# Patient Record
Sex: Female | Born: 1945 | Race: Black or African American | Hispanic: No | Smoking: Never smoker
Health system: Southern US, Community
[De-identification: ages and names within clinical notes are randomized; demographics above are authoritative.]

## PROBLEM LIST (undated history)

## (undated) DIAGNOSIS — I10 Essential (primary) hypertension: Secondary | ICD-10-CM

## (undated) DIAGNOSIS — D649 Anemia, unspecified: Secondary | ICD-10-CM

## (undated) DIAGNOSIS — J189 Pneumonia, unspecified organism: Secondary | ICD-10-CM

## (undated) DIAGNOSIS — J45909 Unspecified asthma, uncomplicated: Secondary | ICD-10-CM

## (undated) DIAGNOSIS — E119 Type 2 diabetes mellitus without complications: Secondary | ICD-10-CM

## (undated) DIAGNOSIS — E7800 Pure hypercholesterolemia, unspecified: Secondary | ICD-10-CM

## (undated) DIAGNOSIS — M1990 Unspecified osteoarthritis, unspecified site: Secondary | ICD-10-CM

## (undated) DIAGNOSIS — R0600 Dyspnea, unspecified: Secondary | ICD-10-CM

## (undated) HISTORY — PX: NO PAST SURGERIES: SHX2092

---

## 2007-10-01 ENCOUNTER — Emergency Department (HOSPITAL_COMMUNITY): Admission: EM | Admit: 2007-10-01 | Discharge: 2007-10-01 | Payer: Self-pay | Admitting: Emergency Medicine

## 2008-10-14 ENCOUNTER — Emergency Department (HOSPITAL_COMMUNITY): Admission: EM | Admit: 2008-10-14 | Discharge: 2008-10-14 | Payer: Self-pay | Admitting: Emergency Medicine

## 2012-02-28 DIAGNOSIS — E785 Hyperlipidemia, unspecified: Secondary | ICD-10-CM | POA: Diagnosis not present

## 2012-02-28 DIAGNOSIS — I10 Essential (primary) hypertension: Secondary | ICD-10-CM | POA: Diagnosis not present

## 2012-02-28 DIAGNOSIS — E119 Type 2 diabetes mellitus without complications: Secondary | ICD-10-CM | POA: Diagnosis not present

## 2012-05-04 DIAGNOSIS — M1710 Unilateral primary osteoarthritis, unspecified knee: Secondary | ICD-10-CM | POA: Diagnosis not present

## 2012-08-10 DIAGNOSIS — M1710 Unilateral primary osteoarthritis, unspecified knee: Secondary | ICD-10-CM | POA: Diagnosis not present

## 2012-08-25 DIAGNOSIS — E785 Hyperlipidemia, unspecified: Secondary | ICD-10-CM | POA: Diagnosis not present

## 2012-08-25 DIAGNOSIS — Z23 Encounter for immunization: Secondary | ICD-10-CM | POA: Diagnosis not present

## 2012-08-25 DIAGNOSIS — I10 Essential (primary) hypertension: Secondary | ICD-10-CM | POA: Diagnosis not present

## 2012-08-25 DIAGNOSIS — E119 Type 2 diabetes mellitus without complications: Secondary | ICD-10-CM | POA: Diagnosis not present

## 2012-11-08 DIAGNOSIS — M1710 Unilateral primary osteoarthritis, unspecified knee: Secondary | ICD-10-CM | POA: Diagnosis not present

## 2013-02-15 DIAGNOSIS — H40019 Open angle with borderline findings, low risk, unspecified eye: Secondary | ICD-10-CM | POA: Diagnosis not present

## 2013-02-15 DIAGNOSIS — H2510 Age-related nuclear cataract, unspecified eye: Secondary | ICD-10-CM | POA: Diagnosis not present

## 2013-02-15 DIAGNOSIS — E119 Type 2 diabetes mellitus without complications: Secondary | ICD-10-CM | POA: Diagnosis not present

## 2013-03-29 DIAGNOSIS — I10 Essential (primary) hypertension: Secondary | ICD-10-CM | POA: Diagnosis not present

## 2013-03-29 DIAGNOSIS — M79609 Pain in unspecified limb: Secondary | ICD-10-CM | POA: Diagnosis not present

## 2013-03-29 DIAGNOSIS — E119 Type 2 diabetes mellitus without complications: Secondary | ICD-10-CM | POA: Diagnosis not present

## 2013-03-29 DIAGNOSIS — E785 Hyperlipidemia, unspecified: Secondary | ICD-10-CM | POA: Diagnosis not present

## 2013-05-02 DIAGNOSIS — M1710 Unilateral primary osteoarthritis, unspecified knee: Secondary | ICD-10-CM | POA: Diagnosis not present

## 2013-06-21 DIAGNOSIS — M1710 Unilateral primary osteoarthritis, unspecified knee: Secondary | ICD-10-CM | POA: Diagnosis not present

## 2013-09-20 DIAGNOSIS — M1710 Unilateral primary osteoarthritis, unspecified knee: Secondary | ICD-10-CM | POA: Diagnosis not present

## 2013-11-01 DIAGNOSIS — E785 Hyperlipidemia, unspecified: Secondary | ICD-10-CM | POA: Diagnosis not present

## 2013-11-01 DIAGNOSIS — I10 Essential (primary) hypertension: Secondary | ICD-10-CM | POA: Diagnosis not present

## 2013-11-01 DIAGNOSIS — E1129 Type 2 diabetes mellitus with other diabetic kidney complication: Secondary | ICD-10-CM | POA: Diagnosis not present

## 2013-12-20 DIAGNOSIS — M1710 Unilateral primary osteoarthritis, unspecified knee: Secondary | ICD-10-CM | POA: Diagnosis not present

## 2014-02-15 DIAGNOSIS — Z23 Encounter for immunization: Secondary | ICD-10-CM | POA: Diagnosis not present

## 2014-03-28 DIAGNOSIS — M170 Bilateral primary osteoarthritis of knee: Secondary | ICD-10-CM | POA: Diagnosis not present

## 2014-08-15 DIAGNOSIS — M170 Bilateral primary osteoarthritis of knee: Secondary | ICD-10-CM | POA: Diagnosis not present

## 2014-10-03 DIAGNOSIS — E785 Hyperlipidemia, unspecified: Secondary | ICD-10-CM | POA: Diagnosis not present

## 2014-10-03 DIAGNOSIS — E119 Type 2 diabetes mellitus without complications: Secondary | ICD-10-CM | POA: Diagnosis not present

## 2014-10-03 DIAGNOSIS — M899 Disorder of bone, unspecified: Secondary | ICD-10-CM | POA: Diagnosis not present

## 2014-10-03 DIAGNOSIS — I10 Essential (primary) hypertension: Secondary | ICD-10-CM | POA: Diagnosis not present

## 2014-10-03 DIAGNOSIS — M0080 Arthritis due to other bacteria, unspecified joint: Secondary | ICD-10-CM | POA: Diagnosis not present

## 2014-10-08 ENCOUNTER — Other Ambulatory Visit (HOSPITAL_COMMUNITY): Payer: Self-pay | Admitting: Internal Medicine

## 2014-10-08 DIAGNOSIS — M8580 Other specified disorders of bone density and structure, unspecified site: Secondary | ICD-10-CM

## 2014-10-08 DIAGNOSIS — Z1231 Encounter for screening mammogram for malignant neoplasm of breast: Secondary | ICD-10-CM

## 2014-10-11 DIAGNOSIS — I10 Essential (primary) hypertension: Secondary | ICD-10-CM | POA: Diagnosis not present

## 2014-10-11 DIAGNOSIS — Z23 Encounter for immunization: Secondary | ICD-10-CM | POA: Diagnosis not present

## 2014-10-11 DIAGNOSIS — R944 Abnormal results of kidney function studies: Secondary | ICD-10-CM | POA: Diagnosis not present

## 2014-10-11 DIAGNOSIS — E119 Type 2 diabetes mellitus without complications: Secondary | ICD-10-CM | POA: Diagnosis not present

## 2014-10-11 DIAGNOSIS — E785 Hyperlipidemia, unspecified: Secondary | ICD-10-CM | POA: Diagnosis not present

## 2014-10-14 ENCOUNTER — Ambulatory Visit (HOSPITAL_COMMUNITY)
Admission: RE | Admit: 2014-10-14 | Discharge: 2014-10-14 | Disposition: A | Discharge status: Home / Self Care | Payer: Medicare Other | Source: Ambulatory Visit | Attending: Internal Medicine | Admitting: Internal Medicine

## 2014-10-14 DIAGNOSIS — R29890 Loss of height: Secondary | ICD-10-CM | POA: Diagnosis not present

## 2014-10-14 DIAGNOSIS — Z780 Asymptomatic menopausal state: Secondary | ICD-10-CM | POA: Insufficient documentation

## 2014-10-14 DIAGNOSIS — M8580 Other specified disorders of bone density and structure, unspecified site: Secondary | ICD-10-CM

## 2014-10-14 DIAGNOSIS — Z13820 Encounter for screening for osteoporosis: Secondary | ICD-10-CM | POA: Diagnosis not present

## 2014-10-14 DIAGNOSIS — Z1231 Encounter for screening mammogram for malignant neoplasm of breast: Secondary | ICD-10-CM | POA: Diagnosis present

## 2014-10-18 DIAGNOSIS — I10 Essential (primary) hypertension: Secondary | ICD-10-CM | POA: Diagnosis not present

## 2014-10-18 DIAGNOSIS — R944 Abnormal results of kidney function studies: Secondary | ICD-10-CM | POA: Diagnosis not present

## 2014-10-18 DIAGNOSIS — E785 Hyperlipidemia, unspecified: Secondary | ICD-10-CM | POA: Diagnosis not present

## 2014-10-18 DIAGNOSIS — Z23 Encounter for immunization: Secondary | ICD-10-CM | POA: Diagnosis not present

## 2014-10-18 DIAGNOSIS — E119 Type 2 diabetes mellitus without complications: Secondary | ICD-10-CM | POA: Diagnosis not present

## 2014-10-18 DIAGNOSIS — Z01419 Encounter for gynecological examination (general) (routine) without abnormal findings: Secondary | ICD-10-CM | POA: Diagnosis not present

## 2015-01-06 DIAGNOSIS — M170 Bilateral primary osteoarthritis of knee: Secondary | ICD-10-CM | POA: Diagnosis not present

## 2015-01-17 DIAGNOSIS — E785 Hyperlipidemia, unspecified: Secondary | ICD-10-CM | POA: Diagnosis not present

## 2015-01-17 DIAGNOSIS — E119 Type 2 diabetes mellitus without complications: Secondary | ICD-10-CM | POA: Diagnosis not present

## 2015-01-17 DIAGNOSIS — I10 Essential (primary) hypertension: Secondary | ICD-10-CM | POA: Diagnosis not present

## 2015-01-21 DIAGNOSIS — E785 Hyperlipidemia, unspecified: Secondary | ICD-10-CM | POA: Diagnosis not present

## 2015-01-21 DIAGNOSIS — Z23 Encounter for immunization: Secondary | ICD-10-CM | POA: Diagnosis not present

## 2015-01-21 DIAGNOSIS — R944 Abnormal results of kidney function studies: Secondary | ICD-10-CM | POA: Diagnosis not present

## 2015-01-21 DIAGNOSIS — I10 Essential (primary) hypertension: Secondary | ICD-10-CM | POA: Diagnosis not present

## 2015-01-21 DIAGNOSIS — E1165 Type 2 diabetes mellitus with hyperglycemia: Secondary | ICD-10-CM | POA: Diagnosis not present

## 2015-02-11 DIAGNOSIS — H6120 Impacted cerumen, unspecified ear: Secondary | ICD-10-CM | POA: Diagnosis not present

## 2015-02-11 DIAGNOSIS — I10 Essential (primary) hypertension: Secondary | ICD-10-CM | POA: Diagnosis not present

## 2015-02-12 DIAGNOSIS — M25561 Pain in right knee: Secondary | ICD-10-CM | POA: Diagnosis not present

## 2015-02-12 DIAGNOSIS — M170 Bilateral primary osteoarthritis of knee: Secondary | ICD-10-CM | POA: Diagnosis not present

## 2015-02-12 DIAGNOSIS — M25562 Pain in left knee: Secondary | ICD-10-CM | POA: Diagnosis not present

## 2015-02-18 DIAGNOSIS — H52223 Regular astigmatism, bilateral: Secondary | ICD-10-CM | POA: Diagnosis not present

## 2015-02-18 DIAGNOSIS — G514 Facial myokymia: Secondary | ICD-10-CM | POA: Diagnosis not present

## 2015-02-18 DIAGNOSIS — H524 Presbyopia: Secondary | ICD-10-CM | POA: Diagnosis not present

## 2015-02-18 DIAGNOSIS — H5203 Hypermetropia, bilateral: Secondary | ICD-10-CM | POA: Diagnosis not present

## 2015-04-09 DIAGNOSIS — M25562 Pain in left knee: Secondary | ICD-10-CM | POA: Diagnosis not present

## 2015-04-09 DIAGNOSIS — M1712 Unilateral primary osteoarthritis, left knee: Secondary | ICD-10-CM | POA: Diagnosis not present

## 2015-04-09 DIAGNOSIS — M25561 Pain in right knee: Secondary | ICD-10-CM | POA: Diagnosis not present

## 2015-04-09 DIAGNOSIS — M170 Bilateral primary osteoarthritis of knee: Secondary | ICD-10-CM | POA: Diagnosis not present

## 2015-04-09 DIAGNOSIS — M1711 Unilateral primary osteoarthritis, right knee: Secondary | ICD-10-CM | POA: Diagnosis not present

## 2015-05-22 DIAGNOSIS — E785 Hyperlipidemia, unspecified: Secondary | ICD-10-CM | POA: Diagnosis not present

## 2015-05-22 DIAGNOSIS — E119 Type 2 diabetes mellitus without complications: Secondary | ICD-10-CM | POA: Diagnosis not present

## 2015-05-27 DIAGNOSIS — E782 Mixed hyperlipidemia: Secondary | ICD-10-CM | POA: Diagnosis not present

## 2015-05-27 DIAGNOSIS — E1122 Type 2 diabetes mellitus with diabetic chronic kidney disease: Secondary | ICD-10-CM | POA: Diagnosis not present

## 2015-05-27 DIAGNOSIS — N182 Chronic kidney disease, stage 2 (mild): Secondary | ICD-10-CM | POA: Diagnosis not present

## 2015-05-27 DIAGNOSIS — I10 Essential (primary) hypertension: Secondary | ICD-10-CM | POA: Diagnosis not present

## 2015-06-02 ENCOUNTER — Telehealth: Payer: Self-pay

## 2015-06-02 DIAGNOSIS — Z1211 Encounter for screening for malignant neoplasm of colon: Secondary | ICD-10-CM

## 2015-06-02 NOTE — Telephone Encounter (Signed)
[id number]    PATIENT RECEIVED LETTER FOR TCS

## 2015-06-03 ENCOUNTER — Other Ambulatory Visit: Payer: Self-pay

## 2015-06-03 DIAGNOSIS — Z1211 Encounter for screening for malignant neoplasm of colon: Secondary | ICD-10-CM

## 2015-06-03 NOTE — Telephone Encounter (Signed)
See separate triage.  

## 2015-06-03 NOTE — Telephone Encounter (Signed)
Gastroenterology Pre-Procedure Review  Request Date: 06/02/2015 Requesting Physician: Dr. Wende Neighbors  PATIENT REVIEW QUESTIONS: The patient responded to the following health history questions as indicated:    1. Diabetes Melitis: no 2. Joint replacements in the past 12 months: no 3. Major health problems in the past 3 months: no 4. Has an artificial valve or MVP: no 5. Has a defibrillator: no 6. Has been advised in past to take antibiotics in advance of a procedure like teeth cleaning: no 7. Family history of colon cancer: no  8. Alcohol Use: OCCASIONALLY   SHE WILL HAVE A DRINK ONCE OR TWICE A MONTH 9. History of sleep apnea: no     MEDICATIONS & ALLERGIES: Coumadi    Patient reports the following regarding taking any blood thinners:   Plavix? no Aspirin? Yes Coumadin? NO  Patient confirms/reports the following medications:  Current Outpatient Prescriptions  Medication Sig Dispense Refill  . amLODipine (NORVASC) 10 MG tablet Take 10 mg by mouth daily.    Marland Kitchen aspirin 81 MG tablet Take 81 mg by mouth daily.    Marland Kitchen ibuprofen (ADVIL,MOTRIN) 600 MG tablet Take 600 mg by mouth every 6 (six) hours as needed. Takes one to two tablets every day as needed    . lovastatin (MEVACOR) 20 MG tablet Take 20 mg by mouth at bedtime.    . NON FORMULARY Iron OTC  ( not sure of strength  She has just run out)  But takes one tablet every other day    . potassium chloride (K-DUR) 10 MEQ tablet Take 10 mEq by mouth 2 (two) times daily.     No current facility-administered medications for this visit.    Patient confirms/reports the following allergies:  No Known Allergies  No orders of the defined types were placed in this encounter.    AUTHORIZATION INFORMATION Primary Insurance:   ID #:  Group #:  Pre-Cert / Auth required:  Pre-Cert / Auth #:   Secondary Insurance:   ID #: Group #:  Pre-Cert / Auth required: Pre-Cert / Auth #:   SCHEDULE INFORMATION: Procedure has been scheduled as follows:   Date: 06/20/2015            Time:  9:00 AM Location: [facility] Short Stay  This Gastroenterology Pre-Precedure Review Form is being routed to the following provider(s): R. Garfield Cornea, MD

## 2015-06-03 NOTE — Telephone Encounter (Signed)
Appropriate.

## 2015-06-04 MED ORDER — PEG 3350-KCL-NA BICARB-NACL 420 G PO SOLR: 4000.0000 mL | ORAL | Status: DC

## 2015-06-04 NOTE — Telephone Encounter (Signed)
Rx sent to the pharmacy and instructions mailed to pt.  

## 2015-06-20 ENCOUNTER — Ambulatory Visit (HOSPITAL_COMMUNITY)
Admission: RE | Admit: 2015-06-20 | Discharge: 2015-06-20 | Disposition: A | Discharge status: Home / Self Care | Payer: Medicare Other | Source: Ambulatory Visit | Attending: Internal Medicine | Admitting: Internal Medicine

## 2015-06-20 ENCOUNTER — Encounter (HOSPITAL_COMMUNITY)
Admission: RE | Disposition: A | Discharge status: Home / Self Care | Payer: Self-pay | Source: Ambulatory Visit | Attending: Internal Medicine

## 2015-06-20 ENCOUNTER — Encounter (HOSPITAL_COMMUNITY): Payer: Self-pay | Admitting: *Deleted

## 2015-06-20 DIAGNOSIS — E7800 Pure hypercholesterolemia, unspecified: Secondary | ICD-10-CM | POA: Insufficient documentation

## 2015-06-20 DIAGNOSIS — Z86010 Personal history of colonic polyps: Secondary | ICD-10-CM | POA: Insufficient documentation

## 2015-06-20 DIAGNOSIS — Z1211 Encounter for screening for malignant neoplasm of colon: Secondary | ICD-10-CM

## 2015-06-20 DIAGNOSIS — Z7982 Long term (current) use of aspirin: Secondary | ICD-10-CM | POA: Insufficient documentation

## 2015-06-20 DIAGNOSIS — Z791 Long term (current) use of non-steroidal anti-inflammatories (NSAID): Secondary | ICD-10-CM | POA: Diagnosis not present

## 2015-06-20 DIAGNOSIS — D120 Benign neoplasm of cecum: Secondary | ICD-10-CM | POA: Diagnosis not present

## 2015-06-20 DIAGNOSIS — Z79899 Other long term (current) drug therapy: Secondary | ICD-10-CM | POA: Diagnosis not present

## 2015-06-20 DIAGNOSIS — I10 Essential (primary) hypertension: Secondary | ICD-10-CM | POA: Diagnosis not present

## 2015-06-20 DIAGNOSIS — D123 Benign neoplasm of transverse colon: Secondary | ICD-10-CM | POA: Diagnosis not present

## 2015-06-20 HISTORY — DX: Pure hypercholesterolemia, unspecified: E78.00

## 2015-06-20 HISTORY — PX: POLYPECTOMY: SHX5525

## 2015-06-20 HISTORY — PX: COLONOSCOPY: SHX5424

## 2015-06-20 HISTORY — DX: Essential (primary) hypertension: I10

## 2015-06-20 SURGERY — COLONOSCOPY
Anesthesia: Moderate Sedation

## 2015-06-20 MED ORDER — SODIUM CHLORIDE 0.9% FLUSH
INTRAVENOUS | Status: AC
  Filled 2015-06-20: qty 10

## 2015-06-20 MED ORDER — MEPERIDINE HCL 100 MG/ML IJ SOLN
INTRAMUSCULAR | Status: DC | PRN
  Administered 2015-06-20 (×2): 50 mg via INTRAVENOUS

## 2015-06-20 MED ORDER — ONDANSETRON HCL 4 MG/2ML IJ SOLN
INTRAMUSCULAR | Status: AC
  Filled 2015-06-20: qty 2

## 2015-06-20 MED ORDER — SODIUM CHLORIDE 0.9 % IV SOLN
INTRAVENOUS | Status: DC
  Administered 2015-06-20: 08:00:00 via INTRAVENOUS

## 2015-06-20 MED ORDER — STERILE WATER FOR IRRIGATION IR SOLN
Status: DC | PRN
  Administered 2015-06-20: 09:00:00

## 2015-06-20 MED ORDER — MEPERIDINE HCL 100 MG/ML IJ SOLN
INTRAMUSCULAR | Status: AC
  Filled 2015-06-20: qty 2

## 2015-06-20 MED ORDER — MIDAZOLAM HCL 5 MG/5ML IJ SOLN
INTRAMUSCULAR | Status: AC
  Filled 2015-06-20: qty 10

## 2015-06-20 MED ORDER — SIMETHICONE 40 MG/0.6ML PO SUSP
ORAL | Status: AC
  Filled 2015-06-20: qty 30

## 2015-06-20 MED ORDER — ONDANSETRON HCL 4 MG/2ML IJ SOLN
INTRAMUSCULAR | Status: DC | PRN
  Administered 2015-06-20: 4 mg via INTRAVENOUS

## 2015-06-20 MED ORDER — MIDAZOLAM HCL 5 MG/5ML IJ SOLN
INTRAMUSCULAR | Status: DC | PRN
  Administered 2015-06-20 (×2): 2 mg via INTRAVENOUS

## 2015-06-20 NOTE — Op Note (Signed)
[facility] [address] [place], [address]   5COLONOSCOPY PROCEDURE REPORT  PATIENT: Sylvia Sparks, Sylvia Sparks  MR#: [id number] BIRTHDATE: Jan 29, 1946 , 69  yrs. old GENDER: female ENDOSCOPIST: R.  Garfield Cornea, MD FACP [facility] REFERRED EO:[id number] Hall, M.D. PROCEDURE DATE:  2015/07/11 PROCEDURE:   Colonoscopy with snare polypectomy INDICATIONS:First ever average risk colon cancer screening examination. MEDICATIONS: Versed 4 mg IV and Demerol 100 mg IV in divided doses. Zofran 4 mg IV. ASA CLASS:       Class II  CONSENT: The risks, benefits, alternatives and imponderables including but not limited to bleeding, perforation as well as the possibility of a missed lesion have been reviewed.  The potential for biopsy, lesion removal, etc. have also been discussed. Questions have been answered.  All parties agreeable.  Please see the history and physical in the medical record for more information.  DESCRIPTION OF PROCEDURE:   After the risks benefits and alternatives of the procedure were thoroughly explained, informed consent was obtained.  The digital rectal exam revealed no abnormalities of the rectum.   The [id number]  endoscope was introduced through the anus and advanced to the cecum, which was identified by both the appendix and ileocecal valve. No adverse events experienced.   The quality of the prep was adequate  The instrument was then slowly withdrawn as the colon was fully examined. Estimated blood loss is zero unless otherwise noted in this procedure report.      COLON FINDINGS: Normal-appearing rectal mucosa.  Few scattered pancolonic diverticula.  Multiple ascending colon polyps - 1.5 cm -1.5 cm flat carpet polyp opposite the ileocecal valve with multiple smaller polyps in the vicinity.  Patient had (2) polyps at the hepatic flexure.  The remainder of the colonic mucosa.  Normal  The large carpet polyp was lifted away from the colonic wall  with submucosal saline injection through the sclero-needle.  This was done nicely without difficulty.  Subsequently , piecemeal hot snare polypectomy was performed.  Multiple other cold and hot snare polypectomies were performed to remove the above-mentioned polyps. Retroflexion was performed. .  Withdrawal time=23 minutes 0 seconds.  The scope was withdrawn and the procedure completed. COMPLICATIONS: There were no immediate complications. EBL 2 mL ENDOSCOPIC IMPRESSION: Multiple ascending colon polyp removed as described above. Pancolonic diverticulosis?"  RECOMMENDATIONS: Follow-up on pathology.  eSigned:  R. Garfield Cornea, MD Rosalita Chessman [facility] 07/11/2015 9:30 AM   cc:  CPT CODES: ICD CODES:  The ICD and CPT codes recommended by this software are interpretations from the data that the clinical staff has captured with the software.  The verification of the translation of this report to the ICD and CPT codes and modifiers is the sole responsibility of the health care institution and practicing physician where this report was generated.  [place] will not be held responsible for the validity of the ICD and CPT codes included on this report.  AMA assumes no liability for data contained or not contained herein. CPT is a Designer, television/film set of the [employer].  PATIENT NAME:  Sylvia Sparks, Sylvia Sparks MR#: [id number]

## 2015-06-20 NOTE — H&P (Signed)
@LOGO @   Primary Care Physician:  Wende Neighbors, MD Primary Gastroenterologist:  Dr. Gala Romney  Pre-Procedure History & Physical: HPI:  Sylvia Sparks is a 70 y.o. female is here for a screening colonoscopy. First ever colonoscopy. No bowel symptoms. No family history of colon cancer.  Past Medical History  Diagnosis Date  . Hypertension   . Hypercholesteremia     Past Surgical History  Procedure Laterality Date  . No past surgeries      Prior to Admission medications   Medication Sig Start Date End Date Taking? Authorizing Provider  amLODipine-valsartan (EXFORGE) 10-160 MG tablet Take 1 tablet by mouth daily. 04/08/15  Yes Historical Provider, MD  aspirin 81 MG tablet Take 81 mg by mouth daily.   Yes Historical Provider, MD  ibuprofen (ADVIL,MOTRIN) 600 MG tablet Take 600 mg by mouth every 6 (six) hours as needed for moderate pain. Takes one to two tablets every day as needed   Yes Historical Provider, MD  lovastatin (MEVACOR) 20 MG tablet Take 20 mg by mouth at bedtime.   Yes Historical Provider, MD  Multiple Vitamins-Minerals (MULTIVITAMINS THER. W/MINERALS) TABS tablet Take 1 tablet by mouth daily.   Yes Historical Provider, MD  NON FORMULARY Iron OTC  ( not sure of strength  She has just run out)  But takes one tablet every other day   Yes Historical Provider, MD  polyethylene glycol-electrolytes (TRILYTE) 420 g solution Take 4,000 mLs by mouth as directed. 06/04/15  Yes Daneil Dolin, MD  potassium chloride (K-DUR) 10 MEQ tablet Take 10 mEq by mouth 2 (two) times daily.   Yes Historical Provider, MD    Allergies as of 06/03/2015  . (No Known Allergies)    History reviewed. No pertinent family history.  Social History   Social History  . Marital Status: Widowed    Spouse Name: N/A  . Number of Children: N/A  . Years of Education: N/A   Occupational History  . Not on file.   Social History Main Topics  . Smoking status: Never Smoker   . Smokeless tobacco: Not on  file  . Alcohol Use: Yes     Comment: Occasional glass of wine  . Drug Use: No  . Sexual Activity: Not on file   Other Topics Concern  . Not on file   Social History Narrative  . No narrative on file    Review of Systems: See HPI, otherwise negative ROS  Physical Exam: BP 136/73 mmHg  Pulse 92  Temp(Src) 97.9 F (36.6 C) (Oral)  Resp 17  Ht 5\' 7"  (1.702 m)  Wt 172 lb (78.019 kg)  BMI 26.93 kg/m2  SpO2 100% General:   Alert,  Well-developed, well-nourished, pleasant and cooperative in NAD Neck:  Supple; no masses or thyromegaly. Lungs:  Clear throughout to auscultation.   No wheezes, crackles, or rhonchi. No acute distress. Heart:  Regular rate and rhythm; no murmurs, clicks, rubs,  or gallops. Abdomen:  Soft, nontender and nondistended. No masses, hepatosplenomegaly or hernias noted. Normal bowel sounds, without guarding, and without rebound.     Impression/Plan: Sylvia Sparks is now here to undergo a screening colonoscopy.  First ever average risk screening examination. Risks, benefits, limitations, imponderables and alternatives regarding colonoscopy have been reviewed with the patient. Questions have been answered. All parties agreeable.     Notice:  This dictation was prepared with Dragon dictation along with smaller phrase technology. Any transcriptional errors that result from this process are unintentional and may not be  corrected upon review.

## 2015-06-20 NOTE — Discharge Instructions (Signed)
Colonoscopy Discharge Instructions  Read the instructions outlined below and refer to this sheet in the next few weeks. These discharge instructions provide you with general information on caring for yourself after you leave the hospital. Your doctor may also give you specific instructions. While your treatment has been planned according to the most current medical practices available, unavoidable complications occasionally occur. If you have any problems or questions after discharge, call Dr. Gala Romney at [phone]. ACTIVITY  You may resume your regular activity, but move at a slower pace for the next 24 hours.   Take frequent rest periods for the next 24 hours.   Walking will help get rid of the air and reduce the bloated feeling in your belly (abdomen).   No driving for 24 hours (because of the medicine (anesthesia) used during the test).    Do not sign any important legal documents or operate any machinery for 24 hours (because of the anesthesia used during the test).  NUTRITION  Drink plenty of fluids.   You may resume your normal diet as instructed by your doctor.   Begin with a light meal and progress to your normal diet. Heavy or fried foods are harder to digest and may make you feel sick to your stomach (nauseated).   Avoid alcoholic beverages for 24 hours or as instructed.  MEDICATIONS  You may resume your normal medications unless your doctor tells you otherwise.  WHAT YOU CAN EXPECT TODAY  Some feelings of bloating in the abdomen.   Passage of more gas than usual.   Spotting of blood in your stool or on the toilet paper.  IF YOU HAD POLYPS REMOVED DURING THE COLONOSCOPY:  No aspirin products for 7 days or as instructed.   No alcohol for 7 days or as instructed.   Eat a soft diet for the next 24 hours.  FINDING OUT THE RESULTS OF YOUR TEST Not all test results are available during your visit. If your test results are not back during the visit, make an appointment  with your caregiver to find out the results. Do not assume everything is normal if you have not heard from your caregiver or the medical facility. It is important for you to follow up on all of your test results.  SEEK IMMEDIATE MEDICAL ATTENTION IF:  You have more than a spotting of blood in your stool.   Your belly is swollen (abdominal distention).   You are nauseated or vomiting.   You have a temperature over 101.   You have abdominal pain or discomfort that is severe or gets worse throughout the day.    Colon polyp and diverticulosis information provided  Further recommendations to follow pending review of pathology report  No aspirin or arthritis medications for 7 days    Colon Polyps Polyps are lumps of extra tissue growing inside the body. Polyps can grow in the large intestine (colon). Most colon polyps are noncancerous (benign). However, some colon polyps can become cancerous over time. Polyps that are larger than a pea may be harmful. To be safe, caregivers remove and test all polyps. CAUSES  Polyps form when mutations in the genes cause your cells to grow and divide even though no more tissue is needed. RISK FACTORS There are a number of risk factors that can increase your chances of getting colon polyps. They include:  Being older than 50 years.  Family history of colon polyps or colon cancer.  Long-term colon diseases, such as colitis or Crohn disease.  Being overweight.  Smoking.  Being inactive.  Drinking too much alcohol. SYMPTOMS  Most small polyps do not cause symptoms. If symptoms are present, they may include:  Blood in the stool. The stool may look dark red or black.  Constipation or diarrhea that lasts longer than 1 week. DIAGNOSIS People often do not know they have polyps until their caregiver finds them during a regular checkup. Your caregiver can use 4 tests to check for polyps:  Digital rectal exam. The caregiver wears gloves and feels  inside the rectum. This test would find polyps only in the rectum.  Barium enema. The caregiver puts a liquid called barium into your rectum before taking X-rays of your colon. Barium makes your colon look white. Polyps are dark, so they are easy to see in the X-ray pictures.  Sigmoidoscopy. A thin, flexible tube (sigmoidoscope) is placed into your rectum. The sigmoidoscope has a light and tiny camera in it. The caregiver uses the sigmoidoscope to look at the last third of your colon.  Colonoscopy. This test is like sigmoidoscopy, but the caregiver looks at the entire colon. This is the most common method for finding and removing polyps. TREATMENT  Any polyps will be removed during a sigmoidoscopy or colonoscopy. The polyps are then tested for cancer. PREVENTION  To help lower your risk of getting more colon polyps:  Eat plenty of fruits and vegetables. Avoid eating fatty foods.  Do not smoke.  Avoid drinking alcohol.  Exercise every day.  Lose weight if recommended by your caregiver.  Eat plenty of calcium and folate. Foods that are rich in calcium include milk, cheese, and broccoli. Foods that are rich in folate include chickpeas, kidney beans, and spinach. HOME CARE INSTRUCTIONS Keep all follow-up appointments as directed by your caregiver. You may need periodic exams to check for polyps. SEEK MEDICAL CARE IF: You notice bleeding during a bowel movement.   This information is not intended to replace advice given to you by your health care provider. Make sure you discuss any questions you have with your health care provider.   Document Released: 01/07/2004 Document Revised: 05/03/2014 Document Reviewed: 06/22/2011 Elsevier Interactive Patient Education 2016 [employer].     Diverticulosis Diverticulosis is the condition that develops when small pouches (diverticula) form in the wall of your colon. Your colon, or large intestine, is where water is absorbed and stool is formed.  The pouches form when the inside layer of your colon pushes through weak spots in the outer layers of your colon. CAUSES  No one knows exactly what causes diverticulosis. RISK FACTORS  Being older than 74. Your risk for this condition increases with age. Diverticulosis is rare in people younger than 40 years. By age 69, almost everyone has it.  Eating a low-fiber diet.  Being frequently constipated.  Being overweight.  Not getting enough exercise.  Smoking.  Taking over-the-counter pain medicines, like aspirin and ibuprofen. SYMPTOMS  Most people with diverticulosis do not have symptoms. DIAGNOSIS  Because diverticulosis often has no symptoms, health care providers often discover the condition during an exam for other colon problems. In many cases, a health care provider will diagnose diverticulosis while using a flexible scope to examine the colon (colonoscopy). TREATMENT  If you have never developed an infection related to diverticulosis, you may not need treatment. If you have had an infection before, treatment may include:  Eating more fruits, vegetables, and grains.  Taking a fiber supplement.  Taking a live bacteria supplement (probiotic).  Taking medicine to relax your colon. HOME CARE INSTRUCTIONS   Drink at least 6-8 glasses of water each day to prevent constipation.  Try not to strain when you have a bowel movement.  Keep all follow-up appointments. If you have had an infection before:  Increase the fiber in your diet as directed by your health care provider or dietitian.  Take a dietary fiber supplement if your health care provider approves.  Only take medicines as directed by your health care provider. SEEK MEDICAL CARE IF:   You have abdominal pain.  You have bloating.  You have cramps.  You have not gone to the bathroom in 3 days. SEEK IMMEDIATE MEDICAL CARE IF:   Your pain gets worse.  Yourbloating becomes very bad.  You have a fever or  chills, and your symptoms suddenly get worse.  You begin vomiting.  You have bowel movements that are bloody or black. MAKE SURE YOU:  Understand these instructions.  Will watch your condition.  Will get help right away if you are not doing well or get worse.   This information is not intended to replace advice given to you by your health care provider. Make sure you discuss any questions you have with your health care provider.   Document Released: 01/08/2004 Document Revised: 04/17/2013 Document Reviewed: 03/07/2013 Elsevier Interactive Patient Education [employer].

## 2015-06-23 ENCOUNTER — Encounter: Payer: Self-pay | Admitting: Internal Medicine

## 2015-06-24 ENCOUNTER — Encounter (HOSPITAL_COMMUNITY): Payer: Self-pay | Admitting: Internal Medicine

## 2015-07-09 DIAGNOSIS — M1712 Unilateral primary osteoarthritis, left knee: Secondary | ICD-10-CM | POA: Diagnosis not present

## 2015-07-09 DIAGNOSIS — M1711 Unilateral primary osteoarthritis, right knee: Secondary | ICD-10-CM | POA: Diagnosis not present

## 2015-07-09 DIAGNOSIS — M170 Bilateral primary osteoarthritis of knee: Secondary | ICD-10-CM | POA: Diagnosis not present

## 2015-07-09 DIAGNOSIS — M25562 Pain in left knee: Secondary | ICD-10-CM | POA: Diagnosis not present

## 2015-07-09 DIAGNOSIS — M25561 Pain in right knee: Secondary | ICD-10-CM | POA: Diagnosis not present

## 2015-10-10 DIAGNOSIS — M25562 Pain in left knee: Secondary | ICD-10-CM | POA: Diagnosis not present

## 2015-10-10 DIAGNOSIS — M25561 Pain in right knee: Secondary | ICD-10-CM | POA: Diagnosis not present

## 2015-10-10 DIAGNOSIS — M170 Bilateral primary osteoarthritis of knee: Secondary | ICD-10-CM | POA: Diagnosis not present

## 2015-12-04 ENCOUNTER — Other Ambulatory Visit (HOSPITAL_COMMUNITY): Payer: Self-pay | Admitting: Adult Health Nurse Practitioner

## 2015-12-04 DIAGNOSIS — Z1231 Encounter for screening mammogram for malignant neoplasm of breast: Secondary | ICD-10-CM

## 2015-12-11 ENCOUNTER — Ambulatory Visit (HOSPITAL_COMMUNITY)
Admission: RE | Admit: 2015-12-11 | Discharge: 2015-12-11 | Disposition: A | Discharge status: Home / Self Care | Payer: Medicare Other | Source: Ambulatory Visit | Attending: Adult Health Nurse Practitioner | Admitting: Adult Health Nurse Practitioner

## 2015-12-11 DIAGNOSIS — Z1231 Encounter for screening mammogram for malignant neoplasm of breast: Secondary | ICD-10-CM | POA: Diagnosis not present

## 2015-12-19 ENCOUNTER — Other Ambulatory Visit: Payer: Self-pay

## 2015-12-30 DIAGNOSIS — Z0000 Encounter for general adult medical examination without abnormal findings: Secondary | ICD-10-CM | POA: Diagnosis not present

## 2015-12-30 DIAGNOSIS — Z23 Encounter for immunization: Secondary | ICD-10-CM | POA: Diagnosis not present

## 2015-12-30 DIAGNOSIS — N182 Chronic kidney disease, stage 2 (mild): Secondary | ICD-10-CM | POA: Diagnosis not present

## 2015-12-30 DIAGNOSIS — I10 Essential (primary) hypertension: Secondary | ICD-10-CM | POA: Diagnosis not present

## 2015-12-30 DIAGNOSIS — E1122 Type 2 diabetes mellitus with diabetic chronic kidney disease: Secondary | ICD-10-CM | POA: Diagnosis not present

## 2015-12-30 DIAGNOSIS — E782 Mixed hyperlipidemia: Secondary | ICD-10-CM | POA: Diagnosis not present

## 2015-12-30 DIAGNOSIS — E785 Hyperlipidemia, unspecified: Secondary | ICD-10-CM | POA: Diagnosis not present

## 2016-01-14 DIAGNOSIS — M170 Bilateral primary osteoarthritis of knee: Secondary | ICD-10-CM | POA: Diagnosis not present

## 2016-01-14 DIAGNOSIS — M25561 Pain in right knee: Secondary | ICD-10-CM | POA: Diagnosis not present

## 2016-02-10 DIAGNOSIS — H25013 Cortical age-related cataract, bilateral: Secondary | ICD-10-CM | POA: Diagnosis not present

## 2016-02-10 DIAGNOSIS — H35033 Hypertensive retinopathy, bilateral: Secondary | ICD-10-CM | POA: Diagnosis not present

## 2016-02-10 DIAGNOSIS — H43812 Vitreous degeneration, left eye: Secondary | ICD-10-CM | POA: Diagnosis not present

## 2016-02-10 DIAGNOSIS — H2513 Age-related nuclear cataract, bilateral: Secondary | ICD-10-CM | POA: Diagnosis not present

## 2016-04-01 DIAGNOSIS — H43812 Vitreous degeneration, left eye: Secondary | ICD-10-CM | POA: Diagnosis not present

## 2016-04-16 DIAGNOSIS — G8929 Other chronic pain: Secondary | ICD-10-CM | POA: Diagnosis not present

## 2016-04-16 DIAGNOSIS — M170 Bilateral primary osteoarthritis of knee: Secondary | ICD-10-CM | POA: Diagnosis not present

## 2016-04-16 DIAGNOSIS — M25561 Pain in right knee: Secondary | ICD-10-CM | POA: Diagnosis not present

## 2016-04-16 DIAGNOSIS — M25562 Pain in left knee: Secondary | ICD-10-CM | POA: Diagnosis not present

## 2016-05-07 DIAGNOSIS — E782 Mixed hyperlipidemia: Secondary | ICD-10-CM | POA: Diagnosis not present

## 2016-05-07 DIAGNOSIS — E1122 Type 2 diabetes mellitus with diabetic chronic kidney disease: Secondary | ICD-10-CM | POA: Diagnosis not present

## 2016-05-19 DIAGNOSIS — I10 Essential (primary) hypertension: Secondary | ICD-10-CM | POA: Diagnosis not present

## 2016-05-19 DIAGNOSIS — E1122 Type 2 diabetes mellitus with diabetic chronic kidney disease: Secondary | ICD-10-CM | POA: Diagnosis not present

## 2016-05-19 DIAGNOSIS — E782 Mixed hyperlipidemia: Secondary | ICD-10-CM | POA: Diagnosis not present

## 2016-07-16 DIAGNOSIS — M25561 Pain in right knee: Secondary | ICD-10-CM | POA: Diagnosis not present

## 2016-07-16 DIAGNOSIS — G8929 Other chronic pain: Secondary | ICD-10-CM | POA: Diagnosis not present

## 2016-07-16 DIAGNOSIS — M25562 Pain in left knee: Secondary | ICD-10-CM | POA: Diagnosis not present

## 2016-07-16 DIAGNOSIS — M170 Bilateral primary osteoarthritis of knee: Secondary | ICD-10-CM | POA: Diagnosis not present

## 2016-10-02 LAB — BASIC METABOLIC PANEL: GLUCOSE: 137

## 2016-10-20 DIAGNOSIS — M170 Bilateral primary osteoarthritis of knee: Secondary | ICD-10-CM | POA: Diagnosis not present

## 2016-10-20 DIAGNOSIS — M25562 Pain in left knee: Secondary | ICD-10-CM | POA: Diagnosis not present

## 2016-10-20 DIAGNOSIS — M25561 Pain in right knee: Secondary | ICD-10-CM | POA: Diagnosis not present

## 2016-10-20 DIAGNOSIS — G8929 Other chronic pain: Secondary | ICD-10-CM | POA: Diagnosis not present

## 2016-11-17 DIAGNOSIS — E1122 Type 2 diabetes mellitus with diabetic chronic kidney disease: Secondary | ICD-10-CM | POA: Diagnosis not present

## 2016-11-22 DIAGNOSIS — Z6827 Body mass index (BMI) 27.0-27.9, adult: Secondary | ICD-10-CM | POA: Diagnosis not present

## 2016-11-22 DIAGNOSIS — N182 Chronic kidney disease, stage 2 (mild): Secondary | ICD-10-CM | POA: Diagnosis not present

## 2016-11-22 DIAGNOSIS — E1122 Type 2 diabetes mellitus with diabetic chronic kidney disease: Secondary | ICD-10-CM | POA: Diagnosis not present

## 2016-11-22 DIAGNOSIS — I10 Essential (primary) hypertension: Secondary | ICD-10-CM | POA: Diagnosis not present

## 2016-11-22 DIAGNOSIS — E782 Mixed hyperlipidemia: Secondary | ICD-10-CM | POA: Diagnosis not present

## 2017-01-19 DIAGNOSIS — M25562 Pain in left knee: Secondary | ICD-10-CM | POA: Diagnosis not present

## 2017-01-19 DIAGNOSIS — G8929 Other chronic pain: Secondary | ICD-10-CM | POA: Diagnosis not present

## 2017-01-19 DIAGNOSIS — M25561 Pain in right knee: Secondary | ICD-10-CM | POA: Diagnosis not present

## 2017-01-19 DIAGNOSIS — M170 Bilateral primary osteoarthritis of knee: Secondary | ICD-10-CM | POA: Diagnosis not present

## 2017-01-31 ENCOUNTER — Other Ambulatory Visit (HOSPITAL_COMMUNITY): Payer: Self-pay | Admitting: Internal Medicine

## 2017-01-31 DIAGNOSIS — Z1231 Encounter for screening mammogram for malignant neoplasm of breast: Secondary | ICD-10-CM

## 2017-02-03 ENCOUNTER — Ambulatory Visit (HOSPITAL_COMMUNITY)
Admission: RE | Admit: 2017-02-03 | Discharge: 2017-02-03 | Disposition: A | Discharge status: Home / Self Care | Payer: Medicare Other | Source: Ambulatory Visit | Attending: Internal Medicine | Admitting: Internal Medicine

## 2017-02-03 DIAGNOSIS — Z1231 Encounter for screening mammogram for malignant neoplasm of breast: Secondary | ICD-10-CM | POA: Insufficient documentation

## 2017-02-10 DIAGNOSIS — Z23 Encounter for immunization: Secondary | ICD-10-CM | POA: Diagnosis not present

## 2017-04-07 DIAGNOSIS — H35033 Hypertensive retinopathy, bilateral: Secondary | ICD-10-CM | POA: Diagnosis not present

## 2017-04-07 DIAGNOSIS — H25013 Cortical age-related cataract, bilateral: Secondary | ICD-10-CM | POA: Diagnosis not present

## 2017-04-07 DIAGNOSIS — H2513 Age-related nuclear cataract, bilateral: Secondary | ICD-10-CM | POA: Diagnosis not present

## 2017-04-07 DIAGNOSIS — H43812 Vitreous degeneration, left eye: Secondary | ICD-10-CM | POA: Diagnosis not present

## 2017-04-21 DIAGNOSIS — M25561 Pain in right knee: Secondary | ICD-10-CM | POA: Diagnosis not present

## 2017-04-21 DIAGNOSIS — G8929 Other chronic pain: Secondary | ICD-10-CM | POA: Diagnosis not present

## 2017-04-21 DIAGNOSIS — M170 Bilateral primary osteoarthritis of knee: Secondary | ICD-10-CM | POA: Diagnosis not present

## 2017-04-21 DIAGNOSIS — M25562 Pain in left knee: Secondary | ICD-10-CM | POA: Diagnosis not present

## 2017-07-22 DIAGNOSIS — M1712 Unilateral primary osteoarthritis, left knee: Secondary | ICD-10-CM | POA: Diagnosis not present

## 2017-07-22 DIAGNOSIS — M1711 Unilateral primary osteoarthritis, right knee: Secondary | ICD-10-CM | POA: Diagnosis not present

## 2017-09-20 DIAGNOSIS — M25552 Pain in left hip: Secondary | ICD-10-CM | POA: Diagnosis not present

## 2017-09-24 LAB — GLUCOSE, POCT (MANUAL RESULT ENTRY): POC GLUCOSE: 98 mg/dL (ref 70–99)

## 2017-10-17 DIAGNOSIS — J060 Acute laryngopharyngitis: Secondary | ICD-10-CM | POA: Diagnosis not present

## 2017-10-17 DIAGNOSIS — Z6827 Body mass index (BMI) 27.0-27.9, adult: Secondary | ICD-10-CM | POA: Diagnosis not present

## 2017-10-17 DIAGNOSIS — H6502 Acute serous otitis media, left ear: Secondary | ICD-10-CM | POA: Diagnosis not present

## 2017-10-19 DIAGNOSIS — M1712 Unilateral primary osteoarthritis, left knee: Secondary | ICD-10-CM | POA: Diagnosis not present

## 2017-10-19 DIAGNOSIS — M1711 Unilateral primary osteoarthritis, right knee: Secondary | ICD-10-CM | POA: Diagnosis not present

## 2017-10-25 ENCOUNTER — Other Ambulatory Visit: Payer: Self-pay | Admitting: Internal Medicine

## 2017-10-25 ENCOUNTER — Ambulatory Visit (HOSPITAL_COMMUNITY)
Admission: RE | Admit: 2017-10-25 | Discharge: 2017-10-25 | Disposition: A | Discharge status: Home / Self Care | Payer: Medicare Other | Source: Ambulatory Visit | Attending: Internal Medicine | Admitting: Internal Medicine

## 2017-10-25 DIAGNOSIS — R918 Other nonspecific abnormal finding of lung field: Secondary | ICD-10-CM | POA: Insufficient documentation

## 2017-10-25 DIAGNOSIS — R05 Cough: Secondary | ICD-10-CM

## 2017-10-25 DIAGNOSIS — R059 Cough, unspecified: Secondary | ICD-10-CM

## 2017-10-30 DIAGNOSIS — W19XXXA Unspecified fall, initial encounter: Secondary | ICD-10-CM | POA: Diagnosis not present

## 2017-10-30 DIAGNOSIS — R58 Hemorrhage, not elsewhere classified: Secondary | ICD-10-CM | POA: Diagnosis not present

## 2017-10-31 DIAGNOSIS — H6502 Acute serous otitis media, left ear: Secondary | ICD-10-CM | POA: Diagnosis not present

## 2017-10-31 DIAGNOSIS — J060 Acute laryngopharyngitis: Secondary | ICD-10-CM | POA: Diagnosis not present

## 2017-10-31 DIAGNOSIS — Z6827 Body mass index (BMI) 27.0-27.9, adult: Secondary | ICD-10-CM | POA: Diagnosis not present

## 2017-11-17 DIAGNOSIS — N182 Chronic kidney disease, stage 2 (mild): Secondary | ICD-10-CM | POA: Diagnosis not present

## 2017-11-17 DIAGNOSIS — I10 Essential (primary) hypertension: Secondary | ICD-10-CM | POA: Diagnosis not present

## 2017-11-17 DIAGNOSIS — E1122 Type 2 diabetes mellitus with diabetic chronic kidney disease: Secondary | ICD-10-CM | POA: Diagnosis not present

## 2017-11-17 DIAGNOSIS — E782 Mixed hyperlipidemia: Secondary | ICD-10-CM | POA: Diagnosis not present

## 2017-11-24 DIAGNOSIS — N182 Chronic kidney disease, stage 2 (mild): Secondary | ICD-10-CM | POA: Diagnosis not present

## 2017-11-24 DIAGNOSIS — Z6826 Body mass index (BMI) 26.0-26.9, adult: Secondary | ICD-10-CM | POA: Diagnosis not present

## 2017-11-24 DIAGNOSIS — E1122 Type 2 diabetes mellitus with diabetic chronic kidney disease: Secondary | ICD-10-CM | POA: Diagnosis not present

## 2017-11-24 DIAGNOSIS — Z712 Person consulting for explanation of examination or test findings: Secondary | ICD-10-CM | POA: Diagnosis not present

## 2017-11-24 DIAGNOSIS — Z0001 Encounter for general adult medical examination with abnormal findings: Secondary | ICD-10-CM | POA: Diagnosis not present

## 2017-11-24 DIAGNOSIS — E782 Mixed hyperlipidemia: Secondary | ICD-10-CM | POA: Diagnosis not present

## 2017-11-24 DIAGNOSIS — I10 Essential (primary) hypertension: Secondary | ICD-10-CM | POA: Diagnosis not present

## 2017-11-25 ENCOUNTER — Other Ambulatory Visit: Payer: Self-pay

## 2018-01-18 DIAGNOSIS — M1711 Unilateral primary osteoarthritis, right knee: Secondary | ICD-10-CM | POA: Diagnosis not present

## 2018-01-18 DIAGNOSIS — M1712 Unilateral primary osteoarthritis, left knee: Secondary | ICD-10-CM | POA: Diagnosis not present

## 2018-02-21 DIAGNOSIS — Z23 Encounter for immunization: Secondary | ICD-10-CM | POA: Diagnosis not present

## 2018-03-14 ENCOUNTER — Other Ambulatory Visit: Payer: Self-pay

## 2018-03-29 ENCOUNTER — Other Ambulatory Visit (HOSPITAL_COMMUNITY): Payer: Self-pay | Admitting: Adult Health Nurse Practitioner

## 2018-03-29 DIAGNOSIS — Z1231 Encounter for screening mammogram for malignant neoplasm of breast: Secondary | ICD-10-CM

## 2018-04-05 ENCOUNTER — Ambulatory Visit (HOSPITAL_COMMUNITY)
Admission: RE | Admit: 2018-04-05 | Discharge: 2018-04-05 | Disposition: A | Discharge status: Home / Self Care | Payer: Medicare Other | Source: Ambulatory Visit | Attending: Adult Health Nurse Practitioner | Admitting: Adult Health Nurse Practitioner

## 2018-04-05 ENCOUNTER — Encounter (HOSPITAL_COMMUNITY): Payer: Self-pay

## 2018-04-05 DIAGNOSIS — E1122 Type 2 diabetes mellitus with diabetic chronic kidney disease: Secondary | ICD-10-CM | POA: Diagnosis not present

## 2018-04-05 DIAGNOSIS — N182 Chronic kidney disease, stage 2 (mild): Secondary | ICD-10-CM | POA: Diagnosis not present

## 2018-04-05 DIAGNOSIS — I10 Essential (primary) hypertension: Secondary | ICD-10-CM | POA: Diagnosis not present

## 2018-04-05 DIAGNOSIS — Z1231 Encounter for screening mammogram for malignant neoplasm of breast: Secondary | ICD-10-CM | POA: Insufficient documentation

## 2018-04-05 DIAGNOSIS — E782 Mixed hyperlipidemia: Secondary | ICD-10-CM | POA: Diagnosis not present

## 2018-04-06 ENCOUNTER — Other Ambulatory Visit (HOSPITAL_COMMUNITY): Payer: Self-pay | Admitting: Internal Medicine

## 2018-04-06 DIAGNOSIS — R928 Other abnormal and inconclusive findings on diagnostic imaging of breast: Secondary | ICD-10-CM

## 2018-04-10 DIAGNOSIS — I10 Essential (primary) hypertension: Secondary | ICD-10-CM | POA: Diagnosis not present

## 2018-04-10 DIAGNOSIS — E1122 Type 2 diabetes mellitus with diabetic chronic kidney disease: Secondary | ICD-10-CM | POA: Diagnosis not present

## 2018-04-10 DIAGNOSIS — E782 Mixed hyperlipidemia: Secondary | ICD-10-CM | POA: Diagnosis not present

## 2018-04-10 DIAGNOSIS — N182 Chronic kidney disease, stage 2 (mild): Secondary | ICD-10-CM | POA: Diagnosis not present

## 2018-04-10 DIAGNOSIS — Z0000 Encounter for general adult medical examination without abnormal findings: Secondary | ICD-10-CM | POA: Diagnosis not present

## 2018-04-10 DIAGNOSIS — M1711 Unilateral primary osteoarthritis, right knee: Secondary | ICD-10-CM | POA: Diagnosis not present

## 2018-04-13 DIAGNOSIS — H35033 Hypertensive retinopathy, bilateral: Secondary | ICD-10-CM | POA: Diagnosis not present

## 2018-04-13 DIAGNOSIS — H35363 Drusen (degenerative) of macula, bilateral: Secondary | ICD-10-CM | POA: Diagnosis not present

## 2018-04-13 DIAGNOSIS — H2513 Age-related nuclear cataract, bilateral: Secondary | ICD-10-CM | POA: Diagnosis not present

## 2018-04-13 DIAGNOSIS — H25013 Cortical age-related cataract, bilateral: Secondary | ICD-10-CM | POA: Diagnosis not present

## 2018-04-17 DIAGNOSIS — M25561 Pain in right knee: Secondary | ICD-10-CM | POA: Diagnosis not present

## 2018-04-17 NOTE — H&P (Signed)
TOTAL KNEE ADMISSION H&P  Patient is being admitted for right total knee arthroplasty.  Subjective:  Chief Complaint:    Right knee primary OA / pain  HPI: Sylvia Sparks, 72 y.o. female, has a history of pain and functional disability in the right knee due to arthritis and has failed non-surgical conservative treatments for greater than 12 weeks to include NSAID's and/or analgesics, corticosteriod injections, viscosupplementation injections, use of assistive devices and activity modification.  Onset of symptoms was gradual, starting >10 years ago with gradually worsening course since that time. The patient noted no past surgery on the right knee(s).  Patient currently rates pain in the right knee(s) at 10 out of 10 with activity. Patient has worsening of pain with activity and weight bearing, pain that interferes with activities of daily living, pain with passive range of motion, crepitus and joint swelling.  Patient has evidence of periarticular osteophytes and joint space narrowing by imaging studies.  There is no active infection.  Risks, benefits and expectations were discussed with the patient.  Risks including but not limited to the risk of anesthesia, blood clots, nerve damage, blood vessel damage, failure of the prosthesis, infection and up to and including death.  Patient understand the risks, benefits and expectations and wishes to proceed with surgery.   PCP: Celene Squibb, MD  D/C Plans:       Home (sister's)  Post-op Meds:       No Rx given   Tranexamic Acid:      To be given - IV   Decadron:      Is to be given  FYI:      ASA  Norco  DME:   Pt equipment arranged  PT:   OPPT arranged   Patient Active Problem List   Diagnosis Date Noted  . History of colonic polyps    Past Medical History:  Diagnosis Date  . Hypercholesteremia   . Hypertension     Past Surgical History:  Procedure Laterality Date  . COLONOSCOPY N/A 06/20/2015   Procedure: COLONOSCOPY;  Surgeon:  Daneil Dolin, MD;  Location: [facility];  Service: Endoscopy;  Laterality: N/A;  9:00 AM  . NO PAST SURGERIES    . POLYPECTOMY N/A 06/20/2015   Procedure: POLYPECTOMY;  Surgeon: Daneil Dolin, MD;  Location: [facility];  Service: Endoscopy;  Laterality: N/A;  Hepatic flexure polyps x 2/ Polyp opposite ileocecal valve    No current facility-administered medications for this encounter.    Current Outpatient Medications  Medication Sig Dispense Refill Last Dose  . amLODipine-valsartan (EXFORGE) 10-160 MG tablet Take 1 tablet by mouth daily.   06/20/2015 at 0545  . aspirin 81 MG tablet Take 81 mg by mouth daily.   06/20/2015 at 0545  . ibuprofen (ADVIL,MOTRIN) 600 MG tablet Take 600 mg by mouth every 6 (six) hours as needed for moderate pain. Takes one to two tablets every day as needed   06/19/2015 at Unknown time  . lovastatin (MEVACOR) 20 MG tablet Take 20 mg by mouth at bedtime.   06/19/2015 at Unknown time  . Multiple Vitamins-Minerals (MULTIVITAMINS THER. W/MINERALS) TABS tablet Take 1 tablet by mouth daily.   06/19/2015 at Unknown time  . NON FORMULARY Iron OTC  ( not sure of strength  She has just run out)  But takes one tablet every other day   Taking  . polyethylene glycol-electrolytes (TRILYTE) 420 g solution Take 4,000 mLs by mouth as directed. 4000 mL 0 06/20/2015  at 0550  . potassium chloride (K-DUR) 10 MEQ tablet Take 10 mEq by mouth 2 (two) times daily.   06/19/2015 at Unknown time   No Known Allergies   Social History   Tobacco Use  . Smoking status: Never Smoker  Substance Use Topics  . Alcohol use: Yes    Comment: Occasional glass of wine       Review of Systems  Constitutional: Negative.   HENT: Negative.   Eyes: Negative.   Respiratory: Negative.   Cardiovascular: Negative.   Gastrointestinal: Negative.   Genitourinary: Positive for frequency.  Musculoskeletal: Positive for joint pain.  Skin: Negative.   Neurological: Negative.   Endo/Heme/Allergies:  Negative.   Psychiatric/Behavioral: Negative.     Objective:  Physical Exam  Constitutional: She is oriented to person, place, and time. She appears well-developed.  HENT:  Head: Normocephalic.  Eyes: Pupils are equal, round, and reactive to light.  Neck: Neck supple. No JVD present. No tracheal deviation present. No thyromegaly present.  Cardiovascular: Normal rate, regular rhythm and intact distal pulses.  Respiratory: Effort normal and breath sounds normal. No respiratory distress. She has no wheezes.  GI: Soft. There is no abdominal tenderness. There is no guarding.  Musculoskeletal:     Right knee: She exhibits decreased range of motion, swelling, abnormal alignment (varus) and bony tenderness. She exhibits no ecchymosis, no deformity, no laceration and no erythema. Tenderness found.  Lymphadenopathy:    She has no cervical adenopathy.  Neurological: She is alert and oriented to person, place, and time.  Skin: Skin is warm and dry.  Psychiatric: She has a normal mood and affect.     Labs:  Estimated body mass index is 26.78 kg/m as calculated from the following:   Height as of 06/20/15: 5\' 7"  (1.702 m).   Weight as of 10/02/16: 77.6 kg.   Imaging Review Plain radiographs demonstrate severe degenerative joint disease of the right knee(s). The overall alignment issignificant varus. The bone quality appears to be good for age and reported activity level.   Preoperative templating of the joint replacement has been completed, documented, and submitted to the Operating Room personnel in order to optimize intra-operative equipment management.    Patient's anticipated LOS is less than 2 midnights, meeting these requirements: - Lives within 1 hour of care - Has a competent adult at home to recover with post-op recover - NO history of  - Chronic pain requiring opiods  - Diabetes  - Coronary Artery Disease  - Heart failure  - Heart attack  - Stroke  - DVT/VTE  - Cardiac  arrhythmia  - Respiratory Failure/COPD  - Renal failure  - Anemia  - Advanced Liver disease    Assessment/Plan:  End stage arthritis, right knee   The patient history, physical examination, clinical judgment of the provider and imaging studies are consistent with end stage degenerative joint disease of the right knee(s) and total knee arthroplasty is deemed medically necessary. The treatment options including medical management, injection therapy arthroscopy and arthroplasty were discussed at length. The risks and benefits of total knee arthroplasty were presented and reviewed. The risks due to aseptic loosening, infection, stiffness, patella tracking problems, thromboembolic complications and other imponderables were discussed. The patient acknowledged the explanation, agreed to proceed with the plan and consent was signed. Patient is being admitted for inpatient treatment for surgery, pain control, PT, OT, prophylactic antibiotics, VTE prophylaxis, progressive ambulation and ADL's and discharge planning. The patient is planning to be discharged home.  [place] Pugh Rorey Bisson   PA-C  04/17/2018, 11:31 AM

## 2018-04-18 ENCOUNTER — Ambulatory Visit (HOSPITAL_COMMUNITY): Payer: Medicare Other

## 2018-04-18 ENCOUNTER — Ambulatory Visit (HOSPITAL_COMMUNITY)
Admission: RE | Admit: 2018-04-18 | Discharge: 2018-04-18 | Disposition: A | Discharge status: Home / Self Care | Payer: Medicare Other | Source: Ambulatory Visit | Attending: Internal Medicine | Admitting: Internal Medicine

## 2018-04-18 DIAGNOSIS — R928 Other abnormal and inconclusive findings on diagnostic imaging of breast: Secondary | ICD-10-CM | POA: Diagnosis not present

## 2018-04-18 DIAGNOSIS — R922 Inconclusive mammogram: Secondary | ICD-10-CM | POA: Diagnosis not present

## 2018-05-02 ENCOUNTER — Encounter (HOSPITAL_COMMUNITY): Payer: Self-pay

## 2018-05-02 NOTE — Pre-Procedure Instructions (Signed)
The following are in epic: CXR 10/25/2017

## 2018-05-02 NOTE — Patient Instructions (Signed)
Your procedure is scheduled on: Tuesday, Jan. 14, 2020   Surgery Time:  10:05AM-11:15AM   Report to [facility]  Entrance    Report to admitting at 8:05 AM   Call this number if you have problems the morning of surgery [phone]   Do not eat food or drink liquids :After Midnight.   Brush your teeth the morning of surgery.   Do NOT smoke after Midnight   Take these medicines the morning of surgery with A SIP OF WATER: Amlodipine   Bring Asthma Inhaler day of surgery  DO NOT TAKE ANY DIABETIC MEDICATIONS DAY OF YOUR SURGERY                               You may not have any metal on your body including hair pins, jewelry, and body piercings             Do not wear make-up, lotions, powders, perfumes/cologne, or deodorant             Do not wear nail polish.  Do not shave  48 hours prior to surgery.                Do not bring valuables to the hospital. [place].   Contacts, dentures or bridgework may not be worn into surgery.   Leave suitcase in the car. After surgery it may be brought to your room.    Patients discharged the day of surgery will not be allowed to drive home.              Please read over the following fact sheets you were given:  [facility] - Preparing for Surgery Before surgery, you can play an important role.  Because skin is not sterile, your skin needs to be as free of germs as possible.  You can reduce the number of germs on your skin by washing with CHG (chlorahexidine gluconate) soap before surgery.  CHG is an antiseptic cleaner which kills germs and bonds with the skin to continue killing germs even after washing. Please DO NOT use if you have an allergy to CHG or antibacterial soaps.  If your skin becomes reddened/irritated stop using the CHG and inform your nurse when you arrive at Short Stay. Do not shave (including legs and underarms) for at least 48 hours prior to the first  CHG shower.  You may shave your face/neck.  Please follow these instructions carefully:  1.  Shower with CHG Soap the night before surgery and the  morning of surgery.  2.  If you choose to wash your hair, wash your hair first as usual with your normal  shampoo.  3.  After you shampoo, rinse your hair and body thoroughly to remove the shampoo.                             4.  Use CHG as you would any other liquid soap.  You can apply chg directly to the skin and wash.  Gently with a scrungie or clean washcloth.  5.  Apply the CHG Soap to your body ONLY FROM THE NECK DOWN.   Do   not use on face/ open  Wound or open sores. Avoid contact with eyes, ears mouth and   genitals (private parts).                       Wash face,  Genitals (private parts) with your normal soap.             6.  Wash thoroughly, paying special attention to the area where your    surgery  will be performed.  7.  Thoroughly rinse your body with warm water from the neck down.  8.  DO NOT shower/wash with your normal soap after using and rinsing off the CHG Soap.                9.  Pat yourself dry with a clean towel.            10.  Wear clean pajamas.            11.  Place clean sheets on your bed the night of your first shower and do not  sleep with pets. Day of Surgery : Do not apply any lotions/deodorants the morning of surgery.  Please wear clean clothes to the hospital/surgery center.  FAILURE TO FOLLOW THESE INSTRUCTIONS MAY RESULT IN THE CANCELLATION OF YOUR SURGERY  PATIENT SIGNATURE_________________________________  NURSE SIGNATURE__________________________________  ________________________________________________________________________   Sylvia Sparks  An incentive spirometer is a tool that can help keep your lungs clear and active. This tool measures how well you are filling your lungs with each breath. Taking long deep breaths may help reverse or decrease the chance of  developing breathing (pulmonary) problems (especially infection) following:  A long period of time when you are unable to move or be active. BEFORE THE PROCEDURE   If the spirometer includes an indicator to show your best effort, your nurse or respiratory therapist will set it to a desired goal.  If possible, sit up straight or lean slightly forward. Try not to slouch.  Hold the incentive spirometer in an upright position. INSTRUCTIONS FOR USE  1. Sit on the edge of your bed if possible, or sit up as far as you can in bed or on a chair. 2. Hold the incentive spirometer in an upright position. 3. Breathe out normally. 4. Place the mouthpiece in your mouth and seal your lips tightly around it. 5. Breathe in slowly and as deeply as possible, raising the piston or the ball toward the top of the column. 6. Hold your breath for 3-5 seconds or for as long as possible. Allow the piston or ball to fall to the bottom of the column. 7. Remove the mouthpiece from your mouth and breathe out normally. 8. Rest for a few seconds and repeat Steps 1 through 7 at least 10 times every 1-2 hours when you are awake. Take your time and take a few normal breaths between deep breaths. 9. The spirometer may include an indicator to show your best effort. Use the indicator as a goal to work toward during each repetition. 10. After each set of 10 deep breaths, practice coughing to be sure your lungs are clear. If you have an incision (the cut made at the time of surgery), support your incision when coughing by placing a pillow or rolled up towels firmly against it. Once you are able to get out of bed, walk around indoors and cough well. You may stop using the incentive spirometer when instructed by your caregiver.  RISKS AND COMPLICATIONS  Take your time  so you do not get dizzy or light-headed.  If you are in pain, you may need to take or ask for pain medication before doing incentive spirometry. It is harder to take a  deep breath if you are having pain. AFTER USE  Rest and breathe slowly and easily.  It can be helpful to keep track of a log of your progress. Your caregiver can provide you with a simple table to help with this. If you are using the spirometer at home, follow these instructions: [place] IF:   You are having difficultly using the spirometer.  You have trouble using the spirometer as often as instructed.  Your pain medication is not giving enough relief while using the spirometer.  You develop fever of 100.5 F (38.1 C) or higher. SEEK IMMEDIATE MEDICAL CARE IF:   You cough up bloody sputum that had not been present before.  You develop fever of 102 F (38.9 C) or greater.  You develop worsening pain at or near the incision site. MAKE SURE YOU:   Understand these instructions.  Will watch your condition.  Will get help right away if you are not doing well or get worse. Document Released: 08/23/2006 Document Revised: 07/05/2011 Document Reviewed: 10/24/2006 ExitCare Patient Information 2014 Exit[employer], [place].   ________________________________________________________________________  WHAT IS A BLOOD TRANSFUSION? Blood Transfusion Information  A transfusion is the replacement of blood or some of its parts. Blood is made up of multiple cells which provide different functions.  Red blood cells carry oxygen and are used for blood loss replacement.  White blood cells fight against infection.  Platelets control bleeding.  Plasma helps clot blood.  Other blood products are available for specialized needs, such as hemophilia or other clotting disorders. BEFORE THE TRANSFUSION  Who gives blood for transfusions?   Healthy volunteers who are fully evaluated to make sure their blood is safe. This is blood bank blood. Transfusion therapy is the safest it has ever been in the practice of medicine. Before blood is taken from a donor, a complete history is taken to make  sure that person has no history of diseases nor engages in risky social behavior (examples are intravenous drug use or sexual activity with multiple partners). The donor's travel history is screened to minimize risk of transmitting infections, such as malaria. The donated blood is tested for signs of infectious diseases, such as HIV and hepatitis. The blood is then tested to be sure it is compatible with you in order to minimize the chance of a transfusion reaction. If you or a relative donates blood, this is often done in anticipation of surgery and is not appropriate for emergency situations. It takes many days to process the donated blood. RISKS AND COMPLICATIONS Although transfusion therapy is very safe and saves many lives, the main dangers of transfusion include:   Getting an infectious disease.  Developing a transfusion reaction. This is an allergic reaction to something in the blood you were given. Every precaution is taken to prevent this. The decision to have a blood transfusion has been considered carefully by your caregiver before blood is given. Blood is not given unless the benefits outweigh the risks. AFTER THE TRANSFUSION  Right after receiving a blood transfusion, you will usually feel much better and more energetic. This is especially true if your red blood cells have gotten low (anemic). The transfusion raises the level of the red blood cells which carry oxygen, and this usually causes an energy increase.  The  nurse administering the transfusion will monitor you carefully for complications. HOME CARE INSTRUCTIONS  No special instructions are needed after a transfusion. You may find your energy is better. Speak with your caregiver about any limitations on activity for underlying diseases you may have. SEEK MEDICAL CARE IF:   Your condition is not improving after your transfusion.  You develop redness or irritation at the intravenous (IV) site. SEEK IMMEDIATE MEDICAL CARE IF:   Any of the following symptoms occur over the next 12 hours:  Shaking chills.  You have a temperature by mouth above 102 F (38.9 C), not controlled by medicine.  Chest, back, or muscle pain.  People around you feel you are not acting correctly or are confused.  Shortness of breath or difficulty breathing.  Dizziness and fainting.  You get a rash or develop hives.  You have a decrease in urine output.  Your urine turns a dark color or changes to pink, red, or brown. Any of the following symptoms occur over the next 10 days:  You have a temperature by mouth above 102 F (38.9 C), not controlled by medicine.  Shortness of breath.  Weakness after normal activity.  The white part of the eye turns yellow (jaundice).  You have a decrease in the amount of urine or are urinating less often.  Your urine turns a dark color or changes to pink, red, or brown. Document Released: 04/09/2000 Document Revised: 07/05/2011 Document Reviewed: 11/27/2007 [employer] Patient Information 2014 [place], [place].  _______________________________________________________________________

## 2018-05-03 ENCOUNTER — Other Ambulatory Visit: Payer: Self-pay

## 2018-05-03 ENCOUNTER — Encounter (HOSPITAL_COMMUNITY): Payer: Self-pay

## 2018-05-03 ENCOUNTER — Encounter (HOSPITAL_COMMUNITY)
Admission: RE | Admit: 2018-05-03 | Discharge: 2018-05-03 | Disposition: A | Discharge status: Home / Self Care | Payer: Medicare Other | Source: Ambulatory Visit | Attending: Orthopedic Surgery | Admitting: Orthopedic Surgery

## 2018-05-03 DIAGNOSIS — Z01812 Encounter for preprocedural laboratory examination: Secondary | ICD-10-CM | POA: Insufficient documentation

## 2018-05-03 HISTORY — DX: Dyspnea, unspecified: R06.00

## 2018-05-03 HISTORY — DX: Pneumonia, unspecified organism: J18.9

## 2018-05-03 HISTORY — DX: Unspecified asthma, uncomplicated: J45.909

## 2018-05-03 HISTORY — DX: Unspecified osteoarthritis, unspecified site: M19.90

## 2018-05-03 HISTORY — DX: Anemia, unspecified: D64.9

## 2018-05-03 HISTORY — DX: Type 2 diabetes mellitus without complications: E11.9

## 2018-05-03 LAB — CBC
HCT: 40.1 % (ref 36.0–46.0)
HEMOGLOBIN: 12.6 g/dL (ref 12.0–15.0)
MCH: 29.6 pg (ref 26.0–34.0)
MCHC: 31.4 g/dL (ref 30.0–36.0)
MCV: 94.4 fL (ref 80.0–100.0)
NRBC: 0 % (ref 0.0–0.2)
Platelets: 272 10*3/uL (ref 150–400)
RBC: 4.25 MIL/uL (ref 3.87–5.11)
RDW: 15.3 % (ref 11.5–15.5)
WBC: 5 10*3/uL (ref 4.0–10.5)

## 2018-05-03 LAB — SURGICAL PCR SCREEN
MRSA, PCR: NEGATIVE
Staphylococcus aureus: NEGATIVE

## 2018-05-03 LAB — BASIC METABOLIC PANEL
Anion gap: 8 (ref 5–15)
BUN: 19 mg/dL (ref 8–23)
CO2: 19 mmol/L — ABNORMAL LOW (ref 22–32)
Calcium: 9.5 mg/dL (ref 8.9–10.3)
Chloride: 114 mmol/L — ABNORMAL HIGH (ref 98–111)
Creatinine, Ser: 1.06 mg/dL — ABNORMAL HIGH (ref 0.44–1.00)
GFR calc non Af Amer: 53 mL/min — ABNORMAL LOW (ref >60)
Glucose, Bld: 91 mg/dL (ref 70–99)
Potassium: 3.7 mmol/L (ref 3.5–5.1)
Sodium: 141 mmol/L (ref 135–145)

## 2018-05-03 LAB — ABO/RH: ABO/RH(D): O POS

## 2018-05-03 LAB — GLUCOSE, CAPILLARY: Glucose-Capillary: 87 mg/dL (ref 70–99)

## 2018-05-03 NOTE — Pre-Procedure Instructions (Signed)
EKG in chart.

## 2018-05-09 ENCOUNTER — Inpatient Hospital Stay (HOSPITAL_COMMUNITY): Payer: Medicare Other | Admitting: Physician Assistant

## 2018-05-09 ENCOUNTER — Encounter (HOSPITAL_COMMUNITY): Payer: Self-pay | Admitting: Emergency Medicine

## 2018-05-09 ENCOUNTER — Other Ambulatory Visit: Payer: Self-pay

## 2018-05-09 ENCOUNTER — Observation Stay (HOSPITAL_COMMUNITY)
Admission: RE | Admit: 2018-05-09 | Discharge: 2018-05-10 | Disposition: A | Discharge status: Home / Self Care | Payer: Medicare Other | Attending: Orthopedic Surgery | Admitting: Orthopedic Surgery

## 2018-05-09 ENCOUNTER — Encounter (HOSPITAL_COMMUNITY)
Admission: RE | Disposition: A | Discharge status: Home / Self Care | Payer: Self-pay | Source: Home / Self Care | Attending: Orthopedic Surgery

## 2018-05-09 ENCOUNTER — Inpatient Hospital Stay (HOSPITAL_COMMUNITY): Payer: Medicare Other | Admitting: Certified Registered Nurse Anesthetist

## 2018-05-09 DIAGNOSIS — Z6826 Body mass index (BMI) 26.0-26.9, adult: Secondary | ICD-10-CM | POA: Diagnosis not present

## 2018-05-09 DIAGNOSIS — M1711 Unilateral primary osteoarthritis, right knee: Principal | ICD-10-CM | POA: Insufficient documentation

## 2018-05-09 DIAGNOSIS — M21161 Varus deformity, not elsewhere classified, right knee: Secondary | ICD-10-CM | POA: Insufficient documentation

## 2018-05-09 DIAGNOSIS — Z79899 Other long term (current) drug therapy: Secondary | ICD-10-CM | POA: Insufficient documentation

## 2018-05-09 DIAGNOSIS — E119 Type 2 diabetes mellitus without complications: Secondary | ICD-10-CM | POA: Diagnosis not present

## 2018-05-09 DIAGNOSIS — I10 Essential (primary) hypertension: Secondary | ICD-10-CM | POA: Diagnosis not present

## 2018-05-09 DIAGNOSIS — J45909 Unspecified asthma, uncomplicated: Secondary | ICD-10-CM | POA: Insufficient documentation

## 2018-05-09 DIAGNOSIS — E7800 Pure hypercholesterolemia, unspecified: Secondary | ICD-10-CM | POA: Diagnosis not present

## 2018-05-09 DIAGNOSIS — E663 Overweight: Secondary | ICD-10-CM | POA: Diagnosis present

## 2018-05-09 DIAGNOSIS — Z7982 Long term (current) use of aspirin: Secondary | ICD-10-CM | POA: Insufficient documentation

## 2018-05-09 DIAGNOSIS — Z96651 Presence of right artificial knee joint: Secondary | ICD-10-CM

## 2018-05-09 DIAGNOSIS — G8918 Other acute postprocedural pain: Secondary | ICD-10-CM | POA: Diagnosis not present

## 2018-05-09 HISTORY — PX: TOTAL KNEE ARTHROPLASTY: SHX125

## 2018-05-09 LAB — HEMOGLOBIN A1C
Hgb A1c MFr Bld: 6 % — ABNORMAL HIGH (ref 4.8–5.6)
Mean Plasma Glucose: 125.5 mg/dL

## 2018-05-09 LAB — GLUCOSE, CAPILLARY
Glucose-Capillary: 82 mg/dL (ref 70–99)
Glucose-Capillary: 130 mg/dL — ABNORMAL HIGH (ref 70–99)
Glucose-Capillary: 394 mg/dL — ABNORMAL HIGH (ref 70–99)
Glucose-Capillary: 400 mg/dL — ABNORMAL HIGH (ref 70–99)
Glucose-Capillary: 392 mg/dL — ABNORMAL HIGH (ref 70–99)
Glucose-Capillary: 240 mg/dL — ABNORMAL HIGH (ref 70–99)

## 2018-05-09 LAB — TYPE AND SCREEN
ABO/RH(D): O POS
Antibody Screen: NEGATIVE

## 2018-05-09 SURGERY — ARTHROPLASTY, KNEE, TOTAL
Anesthesia: Spinal | Site: Knee | Laterality: Right

## 2018-05-09 MED ORDER — POLYSACCHARIDE IRON COMPLEX 150 MG PO CAPS
150.0000 mg | ORAL_CAPSULE | Freq: Every day | ORAL | Status: DC
  Administered 2018-05-09 – 2018-05-10 (×2): 150 mg via ORAL
  Filled 2018-05-09 (×2): qty 1

## 2018-05-09 MED ORDER — SODIUM CHLORIDE (PF) 0.9 % IJ SOLN
INTRAMUSCULAR | Status: DC | PRN
  Administered 2018-05-09: 30 mL

## 2018-05-09 MED ORDER — ASPIRIN 81 MG PO CHEW
81.0000 mg | CHEWABLE_TABLET | Freq: Two times a day (BID) | ORAL | Status: DC
  Administered 2018-05-09 – 2018-05-10 (×2): 81 mg via ORAL
  Filled 2018-05-09 (×2): qty 1

## 2018-05-09 MED ORDER — TRANEXAMIC ACID-NACL 1000-0.7 MG/100ML-% IV SOLN
1000.0000 mg | INTRAVENOUS | Status: AC
  Administered 2018-05-09: 1000 mg via INTRAVENOUS
  Filled 2018-05-09: qty 100

## 2018-05-09 MED ORDER — CEFAZOLIN SODIUM-DEXTROSE 2-4 GM/100ML-% IV SOLN
2.0000 g | Freq: Four times a day (QID) | INTRAVENOUS | Status: AC
  Administered 2018-05-09 (×2): 2 g via INTRAVENOUS
  Filled 2018-05-09 (×2): qty 100

## 2018-05-09 MED ORDER — 0.9 % SODIUM CHLORIDE (POUR BTL) OPTIME
TOPICAL | Status: DC | PRN
  Administered 2018-05-09: 1000 mL

## 2018-05-09 MED ORDER — METOCLOPRAMIDE HCL 5 MG/ML IJ SOLN: 5.0000 mg | Freq: Three times a day (TID) | INTRAMUSCULAR | Status: DC | PRN

## 2018-05-09 MED ORDER — METHOCARBAMOL 500 MG IVPB - SIMPLE MED
500.0000 mg | Freq: Four times a day (QID) | INTRAVENOUS | Status: DC | PRN
  Administered 2018-05-09: 500 mg via INTRAVENOUS
  Filled 2018-05-09: qty 50

## 2018-05-09 MED ORDER — MAGNESIUM CITRATE PO SOLN: 1.0000 | Freq: Once | ORAL | Status: DC | PRN

## 2018-05-09 MED ORDER — DEXAMETHASONE SODIUM PHOSPHATE 10 MG/ML IJ SOLN
10.0000 mg | Freq: Once | INTRAMUSCULAR | Status: AC
  Administered 2018-05-09: 10 mg via INTRAVENOUS

## 2018-05-09 MED ORDER — OXYCODONE HCL 5 MG/5ML PO SOLN: 5.0000 mg | Freq: Once | ORAL | Status: DC | PRN

## 2018-05-09 MED ORDER — PRAVASTATIN SODIUM 20 MG PO TABS
20.0000 mg | ORAL_TABLET | Freq: Every day | ORAL | Status: DC
  Administered 2018-05-09: 20 mg via ORAL
  Filled 2018-05-09: qty 1

## 2018-05-09 MED ORDER — KETOROLAC TROMETHAMINE 30 MG/ML IJ SOLN
INTRAMUSCULAR | Status: AC
  Filled 2018-05-09: qty 1

## 2018-05-09 MED ORDER — ONDANSETRON HCL 4 MG/2ML IJ SOLN: 4.0000 mg | Freq: Four times a day (QID) | INTRAMUSCULAR | Status: DC | PRN

## 2018-05-09 MED ORDER — METHOCARBAMOL 500 MG IVPB - SIMPLE MED
INTRAVENOUS | Status: AC
  Filled 2018-05-09: qty 50

## 2018-05-09 MED ORDER — BUPIVACAINE-EPINEPHRINE (PF) 0.25% -1:200000 IJ SOLN
INTRAMUSCULAR | Status: DC | PRN
  Administered 2018-05-09: 30 mL

## 2018-05-09 MED ORDER — DEXAMETHASONE SODIUM PHOSPHATE 10 MG/ML IJ SOLN
10.0000 mg | Freq: Once | INTRAMUSCULAR | Status: AC
  Administered 2018-05-10: 10 mg via INTRAVENOUS
  Filled 2018-05-09: qty 1

## 2018-05-09 MED ORDER — PHENOL 1.4 % MT LIQD: 1.0000 | OROMUCOSAL | Status: DC | PRN

## 2018-05-09 MED ORDER — DIPHENHYDRAMINE HCL 12.5 MG/5ML PO ELIX: 12.5000 mg | ORAL_SOLUTION | ORAL | Status: DC | PRN

## 2018-05-09 MED ORDER — METOCLOPRAMIDE HCL 5 MG PO TABS: 5.0000 mg | ORAL_TABLET | Freq: Three times a day (TID) | ORAL | Status: DC | PRN

## 2018-05-09 MED ORDER — MORPHINE SULFATE (PF) 2 MG/ML IV SOLN: 0.5000 mg | INTRAVENOUS | Status: DC | PRN

## 2018-05-09 MED ORDER — KETOROLAC TROMETHAMINE 30 MG/ML IJ SOLN
INTRAMUSCULAR | Status: DC | PRN
  Administered 2018-05-09: 30 mg

## 2018-05-09 MED ORDER — PROPOFOL 10 MG/ML IV BOLUS
INTRAVENOUS | Status: AC
  Filled 2018-05-09: qty 60

## 2018-05-09 MED ORDER — CHLORHEXIDINE GLUCONATE 4 % EX LIQD: 60.0000 mL | Freq: Once | CUTANEOUS | Status: DC

## 2018-05-09 MED ORDER — POLYETHYLENE GLYCOL 3350 17 G PO PACK
17.0000 g | PACK | Freq: Two times a day (BID) | ORAL | Status: DC
  Administered 2018-05-09 – 2018-05-10 (×2): 17 g via ORAL
  Filled 2018-05-09 (×2): qty 1

## 2018-05-09 MED ORDER — IRBESARTAN 150 MG PO TABS
150.0000 mg | ORAL_TABLET | Freq: Every day | ORAL | Status: DC
  Administered 2018-05-09 – 2018-05-10 (×2): 150 mg via ORAL
  Filled 2018-05-09 (×2): qty 1

## 2018-05-09 MED ORDER — HYDROCODONE-ACETAMINOPHEN 7.5-325 MG PO TABS: 1.0000 | ORAL_TABLET | ORAL | 0 refills | Status: DC | PRN

## 2018-05-09 MED ORDER — POTASSIUM CHLORIDE ER 10 MEQ PO TBCR
10.0000 meq | EXTENDED_RELEASE_TABLET | Freq: Two times a day (BID) | ORAL | Status: DC
  Administered 2018-05-09 – 2018-05-10 (×2): 10 meq via ORAL
  Filled 2018-05-09 (×4): qty 1

## 2018-05-09 MED ORDER — ONDANSETRON HCL 4 MG PO TABS: 4.0000 mg | ORAL_TABLET | Freq: Four times a day (QID) | ORAL | Status: DC | PRN

## 2018-05-09 MED ORDER — POLYETHYLENE GLYCOL 3350 17 G PO PACK: 17.0000 g | PACK | Freq: Two times a day (BID) | ORAL | 0 refills | Status: DC

## 2018-05-09 MED ORDER — FENTANYL CITRATE (PF) 100 MCG/2ML IJ SOLN
50.0000 ug | INTRAMUSCULAR | Status: DC
  Administered 2018-05-09: 50 ug via INTRAVENOUS
  Filled 2018-05-09: qty 2

## 2018-05-09 MED ORDER — DOCUSATE SODIUM 100 MG PO CAPS: 100.0000 mg | ORAL_CAPSULE | Freq: Two times a day (BID) | ORAL | 0 refills | Status: DC

## 2018-05-09 MED ORDER — PROPOFOL 500 MG/50ML IV EMUL
INTRAVENOUS | Status: DC | PRN
  Administered 2018-05-09: 50 ug/kg/min via INTRAVENOUS

## 2018-05-09 MED ORDER — HYDROCODONE-ACETAMINOPHEN 7.5-325 MG PO TABS
1.0000 | ORAL_TABLET | ORAL | Status: DC | PRN
  Filled 2018-05-09: qty 2

## 2018-05-09 MED ORDER — CARBONYL IRON 45 MG PO TABS: 45.0000 mg | ORAL_TABLET | Freq: Every day | ORAL | Status: DC

## 2018-05-09 MED ORDER — SODIUM CHLORIDE (PF) 0.9 % IJ SOLN
INTRAMUSCULAR | Status: AC
  Filled 2018-05-09: qty 50

## 2018-05-09 MED ORDER — LIDOCAINE 2% (20 MG/ML) 5 ML SYRINGE
INTRAMUSCULAR | Status: DC | PRN
  Administered 2018-05-09: 60 mg via INTRAVENOUS

## 2018-05-09 MED ORDER — MIDAZOLAM HCL 2 MG/2ML IJ SOLN
1.0000 mg | INTRAMUSCULAR | Status: DC
  Administered 2018-05-09: 2 mg via INTRAVENOUS
  Filled 2018-05-09: qty 2

## 2018-05-09 MED ORDER — ALUM & MAG HYDROXIDE-SIMETH 200-200-20 MG/5ML PO SUSP: 15.0000 mL | ORAL | Status: DC | PRN

## 2018-05-09 MED ORDER — ACETAMINOPHEN 325 MG PO TABS: 325.0000 mg | ORAL_TABLET | Freq: Four times a day (QID) | ORAL | Status: DC | PRN

## 2018-05-09 MED ORDER — ONDANSETRON HCL 4 MG/2ML IJ SOLN
INTRAMUSCULAR | Status: DC | PRN
  Administered 2018-05-09: 4 mg via INTRAVENOUS

## 2018-05-09 MED ORDER — SODIUM CHLORIDE 0.9 % IR SOLN
Status: DC | PRN
  Administered 2018-05-09: 1000 mL

## 2018-05-09 MED ORDER — INSULIN ASPART 100 UNIT/ML ~~LOC~~ SOLN
0.0000 [IU] | Freq: Three times a day (TID) | SUBCUTANEOUS | Status: DC
  Administered 2018-05-09: 15 [IU] via SUBCUTANEOUS
  Administered 2018-05-10 (×2): 3 [IU] via SUBCUTANEOUS

## 2018-05-09 MED ORDER — SODIUM CHLORIDE 0.9 % IV SOLN
INTRAVENOUS | Status: DC
  Administered 2018-05-09: 14:00:00 via INTRAVENOUS

## 2018-05-09 MED ORDER — OXYCODONE HCL 5 MG PO TABS: 5.0000 mg | ORAL_TABLET | Freq: Once | ORAL | Status: DC | PRN

## 2018-05-09 MED ORDER — DOCUSATE SODIUM 100 MG PO CAPS
100.0000 mg | ORAL_CAPSULE | Freq: Two times a day (BID) | ORAL | Status: DC
  Administered 2018-05-09 – 2018-05-10 (×2): 100 mg via ORAL
  Filled 2018-05-09 (×2): qty 1

## 2018-05-09 MED ORDER — ONDANSETRON HCL 4 MG/2ML IJ SOLN
INTRAMUSCULAR | Status: AC
  Filled 2018-05-09: qty 2

## 2018-05-09 MED ORDER — BUPIVACAINE HCL (PF) 0.75 % IJ SOLN
INTRAMUSCULAR | Status: DC | PRN
  Administered 2018-05-09: 1.8 mL

## 2018-05-09 MED ORDER — HYDROCODONE-ACETAMINOPHEN 5-325 MG PO TABS
1.0000 | ORAL_TABLET | ORAL | Status: DC | PRN
  Administered 2018-05-09 – 2018-05-10 (×6): 2 via ORAL
  Filled 2018-05-09 (×6): qty 2

## 2018-05-09 MED ORDER — METHOCARBAMOL 500 MG PO TABS: 500.0000 mg | ORAL_TABLET | Freq: Four times a day (QID) | ORAL | 0 refills | Status: DC | PRN

## 2018-05-09 MED ORDER — GLIPIZIDE ER 5 MG PO TB24
5.0000 mg | ORAL_TABLET | Freq: Every day | ORAL | Status: DC
  Administered 2018-05-10: 5 mg via ORAL
  Filled 2018-05-09: qty 1

## 2018-05-09 MED ORDER — VANCOMYCIN HCL 1000 MG IV SOLR
INTRAVENOUS | Status: DC | PRN
  Administered 2018-05-09: 1000 mg

## 2018-05-09 MED ORDER — PROMETHAZINE HCL 25 MG/ML IJ SOLN: 6.2500 mg | INTRAMUSCULAR | Status: DC | PRN

## 2018-05-09 MED ORDER — DEXAMETHASONE SODIUM PHOSPHATE 10 MG/ML IJ SOLN
INTRAMUSCULAR | Status: AC
  Filled 2018-05-09: qty 1

## 2018-05-09 MED ORDER — ASPIRIN 81 MG PO CHEW: 81.0000 mg | CHEWABLE_TABLET | Freq: Two times a day (BID) | ORAL | 0 refills | Status: AC

## 2018-05-09 MED ORDER — MENTHOL 3 MG MT LOZG: 1.0000 | LOZENGE | OROMUCOSAL | Status: DC | PRN

## 2018-05-09 MED ORDER — CELECOXIB 200 MG PO CAPS
200.0000 mg | ORAL_CAPSULE | Freq: Two times a day (BID) | ORAL | Status: DC
  Administered 2018-05-09 – 2018-05-10 (×2): 200 mg via ORAL
  Filled 2018-05-09 (×2): qty 1

## 2018-05-09 MED ORDER — HYDROMORPHONE HCL 1 MG/ML IJ SOLN: 0.2500 mg | INTRAMUSCULAR | Status: DC | PRN

## 2018-05-09 MED ORDER — AMLODIPINE BESYLATE 10 MG PO TABS
10.0000 mg | ORAL_TABLET | Freq: Every day | ORAL | Status: DC
  Administered 2018-05-10: 10 mg via ORAL
  Filled 2018-05-09: qty 1

## 2018-05-09 MED ORDER — STERILE WATER FOR IRRIGATION IR SOLN
Status: DC | PRN
  Administered 2018-05-09: 2000 mL

## 2018-05-09 MED ORDER — BUPIVACAINE-EPINEPHRINE (PF) 0.25% -1:200000 IJ SOLN
INTRAMUSCULAR | Status: AC
  Filled 2018-05-09: qty 30

## 2018-05-09 MED ORDER — BISACODYL 10 MG RE SUPP: 10.0000 mg | Freq: Every day | RECTAL | Status: DC | PRN

## 2018-05-09 MED ORDER — LORATADINE 10 MG PO TABS
10.0000 mg | ORAL_TABLET | Freq: Every day | ORAL | Status: DC
  Administered 2018-05-09 – 2018-05-10 (×2): 10 mg via ORAL
  Filled 2018-05-09 (×2): qty 1

## 2018-05-09 MED ORDER — TRANEXAMIC ACID-NACL 1000-0.7 MG/100ML-% IV SOLN
1000.0000 mg | Freq: Once | INTRAVENOUS | Status: AC
  Administered 2018-05-09: 1000 mg via INTRAVENOUS
  Filled 2018-05-09: qty 100

## 2018-05-09 MED ORDER — ALBUTEROL SULFATE (2.5 MG/3ML) 0.083% IN NEBU: 3.0000 mL | INHALATION_SOLUTION | Freq: Four times a day (QID) | RESPIRATORY_TRACT | Status: DC | PRN

## 2018-05-09 MED ORDER — METHOCARBAMOL 500 MG PO TABS
500.0000 mg | ORAL_TABLET | Freq: Four times a day (QID) | ORAL | Status: DC | PRN
  Administered 2018-05-09 – 2018-05-10 (×3): 500 mg via ORAL
  Filled 2018-05-09 (×3): qty 1

## 2018-05-09 MED ORDER — ROPIVACAINE HCL 5 MG/ML IJ SOLN
INTRAMUSCULAR | Status: DC | PRN
  Administered 2018-05-09: 20 mL via PERINEURAL

## 2018-05-09 MED ORDER — EPHEDRINE SULFATE-NACL 50-0.9 MG/10ML-% IV SOSY
PREFILLED_SYRINGE | INTRAVENOUS | Status: DC | PRN
  Administered 2018-05-09 (×3): 10 mg via INTRAVENOUS

## 2018-05-09 MED ORDER — VANCOMYCIN HCL 1000 MG IV SOLR
INTRAVENOUS | Status: AC
  Filled 2018-05-09: qty 1000

## 2018-05-09 MED ORDER — CEFAZOLIN SODIUM-DEXTROSE 2-4 GM/100ML-% IV SOLN
2.0000 g | INTRAVENOUS | Status: AC
  Administered 2018-05-09: 2 g via INTRAVENOUS
  Filled 2018-05-09: qty 100

## 2018-05-09 MED ORDER — PROPOFOL 10 MG/ML IV BOLUS
INTRAVENOUS | Status: DC | PRN
  Administered 2018-05-09: 20 mg via INTRAVENOUS
  Administered 2018-05-09: 10 mg via INTRAVENOUS

## 2018-05-09 MED ORDER — LACTATED RINGERS IV SOLN
INTRAVENOUS | Status: DC
  Administered 2018-05-09 (×2): via INTRAVENOUS

## 2018-05-09 SURGICAL SUPPLY — 67 items
ATTUNE MED ANAT PAT 35 KNEE (Knees) ×1 IMPLANT
ATTUNE MED ANAT PAT 35MM KNEE (Knees) ×1 IMPLANT
ATTUNE PS FEM RT SZ 4 CEM KNEE (Femur) ×2 IMPLANT
BAG ZIPLOCK 12X15 (MISCELLANEOUS) IMPLANT
BANDAGE ACE 6X5 VEL STRL LF (GAUZE/BANDAGES/DRESSINGS) ×3 IMPLANT
BASE TIBIAL ROT PLAT SZ 5 KNEE (Knees) IMPLANT
BLADE SAW SGTL 11.0X1.19X90.0M (BLADE) IMPLANT
BLADE SAW SGTL 13.0X1.19X90.0M (BLADE) ×3 IMPLANT
BLADE SURG SZ10 CARB STEEL (BLADE) ×6 IMPLANT
BOWL SMART MIX CTS (DISPOSABLE) ×3 IMPLANT
CEMENT HV SMART SET (Cement) ×4 IMPLANT
COVER SURGICAL LIGHT HANDLE (MISCELLANEOUS) ×3 IMPLANT
COVER WAND RF STERILE (DRAPES) ×2 IMPLANT
CUFF TOURN SGL QUICK 34 (TOURNIQUET CUFF) ×2
CUFF TRNQT CYL 34X4X40X1 (TOURNIQUET CUFF) ×1 IMPLANT
DECANTER SPIKE VIAL GLASS SM (MISCELLANEOUS) ×4 IMPLANT
DERMABOND ADVANCED (GAUZE/BANDAGES/DRESSINGS) ×2
DERMABOND ADVANCED .7 DNX12 (GAUZE/BANDAGES/DRESSINGS) ×1 IMPLANT
DRAPE U-SHAPE 47X51 STRL (DRAPES) ×3 IMPLANT
DRESSING AQUACEL AG SP 3.5X10 (GAUZE/BANDAGES/DRESSINGS) ×1 IMPLANT
DRSG AQUACEL AG SP 3.5X10 (GAUZE/BANDAGES/DRESSINGS) ×3
DURAPREP 26ML APPLICATOR (WOUND CARE) ×6 IMPLANT
ELECT REM PT RETURN 15FT ADLT (MISCELLANEOUS) ×3 IMPLANT
GLOVE BIOGEL M 7.0 STRL (GLOVE) IMPLANT
GLOVE BIOGEL PI IND STRL 6.5 (GLOVE) IMPLANT
GLOVE BIOGEL PI IND STRL 7.0 (GLOVE) IMPLANT
GLOVE BIOGEL PI IND STRL 7.5 (GLOVE) ×1 IMPLANT
GLOVE BIOGEL PI IND STRL 8 (GLOVE) IMPLANT
GLOVE BIOGEL PI IND STRL 8.5 (GLOVE) ×1 IMPLANT
GLOVE BIOGEL PI INDICATOR 6.5 (GLOVE) ×2
GLOVE BIOGEL PI INDICATOR 7.0 (GLOVE) ×8
GLOVE BIOGEL PI INDICATOR 7.5 (GLOVE) ×2
GLOVE BIOGEL PI INDICATOR 8 (GLOVE) ×2
GLOVE BIOGEL PI INDICATOR 8.5 (GLOVE) ×2
GLOVE ECLIPSE 7.0 STRL STRAW (GLOVE) ×2 IMPLANT
GLOVE ECLIPSE 8.0 STRL XLNG CF (GLOVE) ×3 IMPLANT
GLOVE ORTHO TXT STRL SZ7.5 (GLOVE) ×6 IMPLANT
GLOVE SURG SS PI 6.5 STRL IVOR (GLOVE) ×2 IMPLANT
GOWN SPEC L4 XLG W/TWL (GOWN DISPOSABLE) ×4 IMPLANT
GOWN STRL REUS W/ TWL LRG LVL4 (GOWN DISPOSABLE) IMPLANT
GOWN STRL REUS W/TWL 2XL LVL3 (GOWN DISPOSABLE) ×3 IMPLANT
GOWN STRL REUS W/TWL LRG LVL3 (GOWN DISPOSABLE) ×3 IMPLANT
GOWN STRL REUS W/TWL LRG LVL4 (GOWN DISPOSABLE) ×2
HANDPIECE INTERPULSE COAX TIP (DISPOSABLE) ×2
HOLDER FOLEY CATH W/STRAP (MISCELLANEOUS) ×2 IMPLANT
INSERT KNEE ATTUNE SZ4 14MM (Insert) ×2 IMPLANT
MANIFOLD NEPTUNE II (INSTRUMENTS) ×3 IMPLANT
NDL SAFETY ECLIPSE 18X1.5 (NEEDLE) IMPLANT
NEEDLE HYPO 18GX1.5 SHARP (NEEDLE) ×2
PACK TOTAL KNEE CUSTOM (KITS) ×3 IMPLANT
PIN FIX SIGMA HP QUICK REL (PIN) ×2 IMPLANT
PIN THREADED HEADED SIGMA (PIN) ×2 IMPLANT
PROTECTOR NERVE ULNAR (MISCELLANEOUS) ×3 IMPLANT
SET HNDPC FAN SPRY TIP SCT (DISPOSABLE) ×1 IMPLANT
SET PAD KNEE POSITIONER (MISCELLANEOUS) ×3 IMPLANT
SUT MNCRL AB 4-0 PS2 18 (SUTURE) ×3 IMPLANT
SUT STRATAFIX PDS+ 0 24IN (SUTURE) ×3 IMPLANT
SUT VIC AB 1 CT1 36 (SUTURE) ×3 IMPLANT
SUT VIC AB 2-0 CT1 27 (SUTURE) ×6
SUT VIC AB 2-0 CT1 TAPERPNT 27 (SUTURE) ×3 IMPLANT
SYR 3ML LL SCALE MARK (SYRINGE) ×2 IMPLANT
TIBIAL BASE ROT PLAT SZ 5 KNEE (Knees) ×3 IMPLANT
TRAY FOLEY CATH 14FRSI W/METER (CATHETERS) ×2 IMPLANT
TRAY FOLEY MTR SLVR 16FR STAT (SET/KITS/TRAYS/PACK) ×1 IMPLANT
WATER STERILE IRR 1000ML POUR (IV SOLUTION) ×3 IMPLANT
WRAP KNEE MAXI GEL POST OP (GAUZE/BANDAGES/DRESSINGS) ×3 IMPLANT
YANKAUER SUCT BULB TIP 10FT TU (MISCELLANEOUS) ×3 IMPLANT

## 2018-05-09 NOTE — Care Plan (Signed)
Ortho Bundle Case Management Note  Patient Details  Name: Sylvia Sparks MRN: [id number] Date of Birth: 08-25-1945  R TKA scheduled on 05-09-2018 DCP:  Home with sister.  2 story home with 5 ste. DME:  RW and 3-in-1 ordered through [facility].  CTU purchased preop from [facility] PT:  [facility].  Eval scheduled on 05-12-2018.                   DME Arranged:  3-N-1, [place] rolling DME Agency:  [employer]  HH Arranged:  NA HH Agency:  NA  Additional Comments: Please contact me with any questions of if this plan should need to change.  Marianne Sofia, RN,CCM [facility]  [phone] 05/09/2018, 8:45 AM

## 2018-05-09 NOTE — Op Note (Signed)
NAME:  Sylvia Sparks                      MEDICAL RECORD NO.:  [id number]                             FACILITY:  [facility]      PHYSICIAN:  Pietro Cassis. Alvan Dame, M.D.  DATE OF BIRTH:  31-Aug-1945      DATE OF PROCEDURE:  05/09/2018                                     OPERATIVE REPORT         PREOPERATIVE DIAGNOSIS:  Right knee osteoarthritis with significant varus deformity.      POSTOPERATIVE DIAGNOSIS:  Right knee osteoarthritis with significant varus deformity.      FINDINGS:  The patient was noted to have complete loss of cartilage and   bone-on-bone arthritis with associated osteophytes in all three compartments of   the knee with a significant synovitis and associated effusion.  The patient had failed months of conservative treatment including medications, injection therapy, activity modification. Significant medial tibial deformity     PROCEDURE:  Right total knee replacement.      COMPONENTS USED:  DePuy Attune rotating platform posterior stabilized knee   system, a size 4 femur, 5 tibia, size 15 mm PS AOX insert, and 35 anatomic patellar   button.      SURGEON:  Pietro Cassis. Alvan Dame, M.D.      ASSISTANT:  Elam Dutch, PA-C.      ANESTHESIA:  Regional and Spinal.      SPECIMENS:  None.      COMPLICATION:  None.      DRAINS:  None.  EBL: <100      TOURNIQUET TIME:   Total Tourniquet Time Documented: Thigh (Right) - 43 minutes Total: Thigh (Right) - 43 minutes  .      The patient was stable to the recovery room.      INDICATION FOR PROCEDURE:  Jonathon Castelo is a 73 y.o. female patient of   mine.  The patient had been seen, evaluated, and treated for months conservatively in the   office with medication, activity modification, and injections.  The patient had   radiographic changes of bone-on-bone arthritis with endplate sclerosis and osteophytes noted.  Based on the radiographic changes and failed conservative measures, the patient   decided to proceed with  definitive treatment, total knee replacement.  Risks of infection, DVT, component failure, need for revision surgery, neurovascular injury were reviewed in the office setting.  The postop course was reviewed stressing the efforts to maximize post-operative satisfaction and function.  Consent was obtained for benefit of pain   relief.      PROCEDURE IN DETAIL:  The patient was brought to the operative theater.   Once adequate anesthesia, preoperative antibiotics, 2 gm of Ancef,1 gm of Tranexamic Acid, and 10 mg of Decadron administered, the patient was positioned supine with a right thigh tourniquet placed.  The  right lower extremity was prepped and draped in sterile fashion.  A time-   out was performed identifying the patient, planned procedure, and the appropriate extremity.      The right lower extremity was placed in the [facility] leg holder.  The leg was   exsanguinated, tourniquet elevated to 250 mmHg.  A midline incision was   made followed by median parapatellar arthrotomy.  Following initial   exposure, attention was first directed to the patella.  Precut   measurement was noted to be 23 mm.  I resected down to 14 mm and used a   35 anatomic patellar button to restore patellar height as well as cover the cut surface.      The lug holes were drilled and a metal shim was placed to protect the   patella from retractors and saw blade during the procedure.      At this point, attention was now directed to the femur.  The femoral   canal was opened with a drill, irrigated to try to prevent fat emboli.  An   intramedullary rod was passed at 3 degrees valgus, 8 mm of bone was   resected off the distal femur.  Following this resection, the tibia was   subluxated anteriorly.  Using the extramedullary guide, 6 mm of bone was resected off   the proximal lateral tibia based on medial deformity.  We confirmed the gap would be   stable medially and laterally with a size 7 spacer block as well as  confirmed that the tibial cut was perpendicular in the coronal plane, checking with an alignment rod.      Once this was done, I sized the femur to be a size 5 in the anterior-   posterior dimension, chose a standard component based on medial and   lateral dimension.  The size 5 rotation block was then pinned in   position anterior referenced using the C-clamp to set rotation.  The   anterior, posterior, and  chamfer cuts were made without difficulty nor   notching making certain that I was along the anterior cortex to help   with flexion gap stability.      The final box cut was made off the lateral aspect of distal femur.  Posterior osteophytes were removed off the posterior femur.     At this point, the tibia was sized to be a size 5.  The size 5 tray was   then pinned in position through the medial third of the tubercle,   drilled, and keel punched.  Trial reduction was now carried with a 5 femur,  5 tibia, a size 10, then 12 mm PS insert, and the 35 anatomic patella botton.  The knee was tight to reduce in flexion but slight hyperextension with the 12 mm PS insert.  I thus removed the trial femur and decided to downsize the femur to a 4 to open the flexion gap.  Once pinned into position I re-cut the posterior femur and chamfers.  I then repeated the trial reduction with the 4 femur, 5 tibia and the 14 PS insert.  The knee was then brought to full extension with good flexion stability with the patella   tracking through the trochlea without application of pressure.  Given   all these findings the trial components removed.  Final components were   opened and cement was mixed.  The knee was irrigated with normal saline solution and pulse lavage.  The synovial lining was   then injected with 30 cc of 0.25% Marcaine with epinephrine, 1 cc of Toradol and 30 cc of NS for a total of 61 cc.     Final implants were then cemented onto cleaned and dried cut surfaces of bone with the knee brought to  extension with a size 14 mm  PS trial insert.      Once the cement had fully cured, excess cement was removed   throughout the knee.  I confirmed that I was satisfied with the range of   motion and stability, and the final 14 mm PS AOX insert was chosen.  It was   placed into the knee.      The tourniquet had been let down at 43 minutes.  No significant   hemostasis was required.  The extensor mechanism was then reapproximated using #1 Vicryl and #1 Stratafix sutures with the knee   in flexion.  The   remaining wound was closed with 2-0 Vicryl and running 4-0 Monocryl.   The knee was cleaned, dried, dressed sterilely using Dermabond and   Aquacel dressing.  The patient was then   brought to recovery room in stable condition, tolerating the procedure   well.   Please note that Physician Assistant, Danae Orleans, PA-C was present for the entirety of the case, and was utilized for pre-operative positioning, peri-operative retractor management, general facilitation of the procedure and for primary wound closure at the end of the case.              Pietro Cassis Alvan Dame, M.D.    05/09/2018 11:33 AM

## 2018-05-09 NOTE — Evaluation (Signed)
Physical Therapy Evaluation Patient Details Name: Sylvia Sparks MRN: [id number] DOB: 11-09-1945 Today's Date: 05/09/2018   History of Present Illness  73 yo female s/p R TKR on 05/09/18. PMH includes colonic polyps s/p polypectomy, HTN.   Clinical Impression  Pt presents with R knee pain, decreased R knee ROM, difficulty performing bed mobility, and decreased tolerance for ambulation due to R knee pain. Pt to benefit from acute PT to address deficits. Pt ambulated 15 ft with RW with min guard assist, verbal cuing provided throughout. Pt educated on ankle pumps (20/hour) to perform this afternoon/evening to increase circulation, to pt's tolerance and limited by pain. PT to progress mobility as tolerated, and will continue to follow acutely.        Follow Up Recommendations Follow surgeon's recommendation for DC plan and follow-up therapies;Supervision for mobility/OOB(OPPT)    Equipment Recommendations  Rolling walker with 5" wheels;3in1 (PT)(Being arranged through [facility] per chart review)    Recommendations for Other Services       Precautions / Restrictions Precautions Precautions: Fall Restrictions Weight Bearing Restrictions: No Other Position/Activity Restrictions: WBAT       Mobility  Bed Mobility Overal bed mobility: Needs Assistance Bed Mobility: Supine to Sit     Supine to sit: Min assist;HOB elevated     General bed mobility comments: Min assist for RLE management. Verbal cuing provided for sequencing. Increased time and effort.   Transfers Overall transfer level: Needs assistance Equipment used: Rolling walker (2 wheeled) Transfers: Sit to/from Stand Sit to Stand: Min guard;From elevated surface         General transfer comment: Min guard for safety. Verbal cuing for hand placement. Increased time to rise.   Ambulation/Gait Ambulation/Gait assistance: Min guard;+2 safety/equipment Gait Distance (Feet): 15 Feet Assistive device: Rolling walker (2  wheeled) Gait Pattern/deviations: Step-to pattern;Decreased weight shift to right Gait velocity: decr    General Gait Details: Min guard for safety. Verbal cuing for placement in RW, turning, and sequencing with step-to gait.   Stairs            Wheelchair Mobility    Modified Rankin (Stroke Patients Only)       Balance Overall balance assessment: Mild deficits observed, not formally tested                                           Pertinent Vitals/Pain Pain Assessment: 0-10 Pain Score: 6  Pain Location: R knee  Pain Descriptors / Indicators: Aching Pain Intervention(s): Limited activity within patient's tolerance;Repositioned;Ice applied;Monitored during session;Premedicated before session    Home Living Family/patient expects to be discharged to:: Private residence Living Arrangements: Alone Available Help at Discharge: Family;Available 24 hours/day(Pt is d/cing to her sister's house for as long as needed; below information is related to her sister's home) Type of Home: House Home Access: Stairs to enter Entrance Stairs-Rails: Right;Left;Can reach both Entrance Stairs-Number of Steps: 1 (curb) +1 +4 (with rails)  Home Layout: Two level;Able to live on main level with bedroom/bathroom;Laundry or work area in [place]: Kasandra Knudsen - single point;Hand held shower head;Tub bench      Prior Function Level of Independence: Independent with assistive device(s)         Comments: Pt reports occasionally using her mother's old cane for mobility PTA.      Hand Dominance   Dominant Hand: Right    Extremity/Trunk Assessment  Upper Extremity Assessment Upper Extremity Assessment: Overall WFL for tasks assessed    Lower Extremity Assessment Lower Extremity Assessment: Overall WFL for tasks assessed;RLE deficits/detail RLE Deficits / Details: suspected post-surgical weakness; able to perform ankle pumps, quad set, heel slide to 50*, SLR  with no quad lag or lift assist RLE Sensation: WNL    Cervical / Trunk Assessment Cervical / Trunk Assessment: Normal  Communication   Communication: No difficulties  Cognition Arousal/Alertness: Awake/alert Behavior During Therapy: WFL for tasks assessed/performed Overall Cognitive Status: Within Functional Limits for tasks assessed                                        General Comments      Exercises Total Joint Exercises Goniometric ROM: knee aarom ~5-80* in sitting, limited by pain    Assessment/Plan    PT Assessment Patient needs continued PT services  PT Problem List Decreased strength;Pain;Decreased range of motion;Decreased activity tolerance;Decreased knowledge of use of DME;Decreased balance;Decreased mobility       PT Treatment Interventions DME instruction;Therapeutic activities;Therapeutic exercise;Gait training;Patient/family education;Balance training;Stair training;Functional mobility training    PT Goals (Current goals can be found in the Care Plan section)  Acute Rehab PT Goals Patient Stated Goal: decrease L knee pain  PT Goal Formulation: With patient Time For Goal Achievement: 05/16/18 Potential to Achieve Goals: Good    Frequency 7X/week   Barriers to discharge        Co-evaluation               AM-PAC PT "6 Clicks" Mobility  Outcome Measure Help needed turning from your back to your side while in a flat bed without using bedrails?: A Little Help needed moving from lying on your back to sitting on the side of a flat bed without using bedrails?: A Little Help needed moving to and from a bed to a chair (including a wheelchair)?: A Little Help needed standing up from a chair using your arms (e.g., wheelchair or bedside chair)?: A Little Help needed to walk in hospital room?: A Little Help needed climbing 3-5 steps with a railing? : A Little 6 Click Score: 18    End of Session Equipment Utilized During Treatment: Gait  belt Activity Tolerance: Patient tolerated treatment well Patient left: in chair;with chair alarm set;with call bell/phone within reach;with family/visitor present;with SCD's reapplied Nurse Communication: Mobility status PT Visit Diagnosis: Difficulty in walking, not elsewhere classified (R26.2);Other abnormalities of gait and mobility (R26.89)    Time: 0938-1829 PT Time Calculation (min) (ACUTE ONLY): 22 min   Charges:   PT Evaluation $PT Eval Low Complexity: 1 Low         Christpher Stogsdill Conception Chancy, PT Acute Rehabilitation Services Pager [phone]  Office [phone]   Rasheedah Reis D Elonda Husky 05/09/2018, 4:13 PM

## 2018-05-09 NOTE — Interval H&P Note (Signed)
History and Physical Interval Note:  05/09/2018 8:44 AM  Sylvia Sparks  has presented today for surgery, with the diagnosis of right knee osteorathitis  The various methods of treatment have been discussed with the patient and family. After consideration of risks, benefits and other options for treatment, the patient has consented to  Procedure(s) with comments: TOTAL KNEE ARTHROPLASTY (Right) - 70 mins as a surgical intervention .  The patient's history has been reviewed, patient examined, no change in status, stable for surgery.  I have reviewed the patient's chart and labs.  Questions were answered to the patient's satisfaction.     Mauri Pole

## 2018-05-09 NOTE — Anesthesia Procedure Notes (Signed)
Anesthesia Regional Block: Adductor canal block   Pre-Anesthetic Checklist: ,, timeout performed, Correct Patient, Correct Site, Correct Laterality, Correct Procedure, Correct Position, site marked, Risks and benefits discussed,  Surgical consent,  Pre-op evaluation,  At surgeon's request and post-op pain management  Laterality: Right  Prep: chloraprep       Needles:  Injection technique: Single-shot  Needle Type: Stimiplex     Needle Length: 9cm  Needle Gauge: 21     Additional Needles:   Procedures:,,,, ultrasound used (permanent image in chart),,,,  Narrative:  Start time: 05/09/2018 8:59 AM End time: 05/09/2018 9:04 AM Injection made incrementally with aspirations every 5 mL.  Performed by: Personally  Anesthesiologist: Lynda Rainwater, MD

## 2018-05-09 NOTE — Anesthesia Preprocedure Evaluation (Signed)
Anesthesia Evaluation  Patient identified by MRN, date of birth, ID band Patient awake    Reviewed: Allergy & Precautions, NPO status , Patient's Chart, lab work & pertinent test results  Airway Mallampati: II  TM Distance: >3 FB Neck ROM: Full    Dental no notable dental hx.    Pulmonary asthma ,    Pulmonary exam normal breath sounds clear to auscultation       Cardiovascular hypertension, Pt. on medications negative cardio ROS Normal cardiovascular exam Rhythm:Regular Rate:Normal     Neuro/Psych negative neurological ROS  negative psych ROS   GI/Hepatic negative GI ROS, Neg liver ROS,   Endo/Other  negative endocrine ROSdiabetes, Type 2  Renal/GU negative Renal ROS  negative genitourinary   Musculoskeletal  (+) Arthritis , Osteoarthritis,    Abdominal   Peds negative pediatric ROS (+)  Hematology negative hematology ROS (+)   Anesthesia Other Findings   Reproductive/Obstetrics negative OB ROS                             Anesthesia Physical Anesthesia Plan  ASA: III  Anesthesia Plan: Spinal   Post-op Pain Management:  Regional for Post-op pain   Induction: Intravenous  PONV Risk Score and Plan: 2 and Ondansetron and Midazolam  Airway Management Planned: Simple Face Mask  Additional Equipment:   Intra-op Plan:   Post-operative Plan:   Informed Consent: I have reviewed the patients History and Physical, chart, labs and discussed the procedure including the risks, benefits and alternatives for the proposed anesthesia with the patient or authorized representative who has indicated his/her understanding and acceptance.   Dental advisory given  Plan Discussed with: CRNA  Anesthesia Plan Comments:         Anesthesia Quick Evaluation

## 2018-05-09 NOTE — Anesthesia Procedure Notes (Signed)
Procedure Name: MAC Date/Time: 05/09/2018 9:45 AM Performed by: West Pugh, CRNA Pre-anesthesia Checklist: Patient identified, Emergency Drugs available, Suction available, Patient being monitored and Timeout performed Patient Re-evaluated:Patient Re-evaluated prior to induction Oxygen Delivery Method: Nasal cannula and Simple face mask Preoxygenation: Pre-oxygenation with 100% oxygen Induction Type: IV induction Number of attempts: 1 Placement Confirmation: positive ETCO2 Dental Injury: Teeth and Oropharynx as per pre-operative assessment

## 2018-05-09 NOTE — Anesthesia Postprocedure Evaluation (Signed)
Anesthesia Post Note  Patient: Sylvia Sparks  Procedure(s) Performed: TOTAL KNEE ARTHROPLASTY (Right Knee)     Patient location during evaluation: PACU Anesthesia Type: Spinal Level of consciousness: awake and alert Pain management: pain level controlled Vital Signs Assessment: post-procedure vital signs reviewed and stable Respiratory status: spontaneous breathing and respiratory function stable Cardiovascular status: blood pressure returned to baseline and stable Postop Assessment: spinal receding Anesthetic complications: no    Last Vitals:  Vitals:   05/09/18 1300 05/09/18 1328  BP:  129/65  Pulse: 73 75  Resp: 10 12  Temp: 36.4 C (!) 36.4 C  SpO2: 100% 100%    Last Pain:  Vitals:   05/09/18 1328  TempSrc: Oral  PainSc: 0-No pain    LLE Motor Response: (P) Purposeful movement (05/09/18 1328)   RLE Motor Response: (P) Purposeful movement (05/09/18 1328)   L Sensory Level: (P) S1-Sole of foot, small toes (05/09/18 1328) R Sensory Level: (P) S1-Sole of foot, small toes (05/09/18 1328)  Lynda Rainwater

## 2018-05-09 NOTE — Anesthesia Procedure Notes (Signed)
Spinal  Patient location during procedure: OR Start time: 05/09/2018 9:54 AM End time: 05/09/2018 9:59 AM Staffing Anesthesiologist: Lynda Rainwater, MD Performed: anesthesiologist  Preanesthetic Checklist Completed: patient identified, site marked, surgical consent, pre-op evaluation, timeout performed, IV checked, risks and benefits discussed and monitors and equipment checked Spinal Block Patient position: sitting Prep: ChloraPrep Patient monitoring: heart rate, continuous pulse ox and blood pressure Approach: midline Location: L3-4 Injection technique: single-shot Needle Needle type: Quincke  Needle gauge: 22 G Needle length: 9 cm

## 2018-05-09 NOTE — Transfer of Care (Signed)
Immediate Anesthesia Transfer of Care Note  Patient: Sylvia Sparks  Procedure(s) Performed: TOTAL KNEE ARTHROPLASTY (Right Knee)  Patient Location: PACU  Anesthesia Type:Spinal  Level of Consciousness: awake, alert  and oriented  Airway & Oxygen Therapy: Patient Spontanous Breathing and Patient connected to face mask oxygen  Post-op Assessment: Report given to RN and Post -op Vital signs reviewed and stable  Post vital signs: Reviewed and stable  Last Vitals:  Vitals Value Taken Time  BP 106/59 05/09/2018 12:03 PM  Temp    Pulse 77 05/09/2018 12:05 PM  Resp 16 05/09/2018 12:05 PM  SpO2 100 % 05/09/2018 12:05 PM  Vitals shown include unvalidated device data.  Last Pain:  Vitals:   05/09/18 1203  TempSrc:   PainSc: (P) 0-No pain      Patients Stated Pain Goal: 4 (14/48/18 5631)  Complications: No apparent anesthesia complications

## 2018-05-09 NOTE — Progress Notes (Signed)
AssistedDr. Miller with right, ultrasound guided, adductor canal block. Side rails up, monitors on throughout procedure. See vital signs in flow sheet. Tolerated Procedure well.  

## 2018-05-10 DIAGNOSIS — M1711 Unilateral primary osteoarthritis, right knee: Secondary | ICD-10-CM | POA: Diagnosis not present

## 2018-05-10 DIAGNOSIS — E7800 Pure hypercholesterolemia, unspecified: Secondary | ICD-10-CM | POA: Diagnosis not present

## 2018-05-10 DIAGNOSIS — M21161 Varus deformity, not elsewhere classified, right knee: Secondary | ICD-10-CM | POA: Diagnosis not present

## 2018-05-10 DIAGNOSIS — E663 Overweight: Secondary | ICD-10-CM | POA: Diagnosis present

## 2018-05-10 DIAGNOSIS — E119 Type 2 diabetes mellitus without complications: Secondary | ICD-10-CM | POA: Diagnosis not present

## 2018-05-10 DIAGNOSIS — J45909 Unspecified asthma, uncomplicated: Secondary | ICD-10-CM | POA: Diagnosis not present

## 2018-05-10 DIAGNOSIS — I10 Essential (primary) hypertension: Secondary | ICD-10-CM | POA: Diagnosis not present

## 2018-05-10 LAB — CBC
HCT: 32.7 % — ABNORMAL LOW (ref 36.0–46.0)
HEMOGLOBIN: 10.3 g/dL — AB (ref 12.0–15.0)
MCH: 30.1 pg (ref 26.0–34.0)
MCHC: 31.5 g/dL (ref 30.0–36.0)
MCV: 95.6 fL (ref 80.0–100.0)
Platelets: 240 10*3/uL (ref 150–400)
RBC: 3.42 MIL/uL — ABNORMAL LOW (ref 3.87–5.11)
RDW: 15.5 % (ref 11.5–15.5)
WBC: 6.1 10*3/uL (ref 4.0–10.5)
nRBC: 0 % (ref 0.0–0.2)

## 2018-05-10 LAB — BASIC METABOLIC PANEL
ANION GAP: 7 (ref 5–15)
BUN: 17 mg/dL (ref 8–23)
CHLORIDE: 112 mmol/L — AB (ref 98–111)
CO2: 20 mmol/L — ABNORMAL LOW (ref 22–32)
Calcium: 8.5 mg/dL — ABNORMAL LOW (ref 8.9–10.3)
Creatinine, Ser: 0.98 mg/dL (ref 0.44–1.00)
GFR calc Af Amer: >60 mL/min (ref >60)
GFR calc non Af Amer: 58 mL/min — ABNORMAL LOW (ref >60)
Glucose, Bld: 218 mg/dL — ABNORMAL HIGH (ref 70–99)
Potassium: 4 mmol/L (ref 3.5–5.1)
Sodium: 139 mmol/L (ref 135–145)

## 2018-05-10 LAB — GLUCOSE, CAPILLARY
Glucose-Capillary: 186 mg/dL — ABNORMAL HIGH (ref 70–99)
Glucose-Capillary: 197 mg/dL — ABNORMAL HIGH (ref 70–99)

## 2018-05-10 NOTE — Progress Notes (Signed)
The pt was provided with d/c instructions and prescriptions. After discussing the pt's plan of care upon d/c home, the pt reported no further questions or concerns.  

## 2018-05-10 NOTE — Progress Notes (Signed)
Physical Therapy Treatment Patient Details Name: Sylvia Sparks MRN: [id number] DOB: Mar 04, 1946 Today's Date: 05/10/2018    History of Present Illness 73 yo female s/p R TKR on 05/09/18. PMH includes colonic polyps s/p polypectomy, HTN.     PT Comments    POD # 1 am session Sister present during session Assisted OOB to amb to bathroom, amb in hallway then returned to room to Performed some TE's following HEP handout.  Instructed on proper tech, freq as well as use of ICE.   Pt will need another PT session to address stairs.   Follow Up Recommendations  Follow surgeon's recommendation for DC plan and follow-up therapies;Supervision for mobility/OOB(OP)     Equipment Recommendations  Rolling walker with 5" wheels;3in1 (PT)    Recommendations for Other Services       Precautions / Restrictions Precautions Precautions: Fall Restrictions Weight Bearing Restrictions: No Other Position/Activity Restrictions: WBAT     Mobility  Bed Mobility Overal bed mobility: Needs Assistance Bed Mobility: Supine to Sit     Supine to sit: Min assist;HOB elevated     General bed mobility comments: assisted OOB with support R LE  Transfers Overall transfer level: Needs assistance Equipment used: Rolling walker (2 wheeled) Transfers: Sit to/from [facility] Sit to Stand: Supervision;Min guard Stand pivot transfers: Supervision;Min guard       General transfer comment: 25% VC's safety with turns  Ambulation/Gait Ambulation/Gait assistance: Min guard;Supervision Gait Distance (Feet): 55 Feet Assistive device: Rolling walker (2 wheeled) Gait Pattern/deviations: Step-to pattern;Decreased weight shift to right Gait velocity: decreased   General Gait Details: 25% VC's on proper walker to self distance and upright posture   Stairs             Wheelchair Mobility    Modified Rankin (Stroke Patients Only)       Balance                                             Cognition Arousal/Alertness: Awake/alert Behavior During Therapy: WFL for tasks assessed/performed Overall Cognitive Status: Within Functional Limits for tasks assessed                                        Exercises   Total Knee Replacement TE's 10 reps B LE ankle pumps 10 reps towel squeezes 10 reps knee presses 10 reps heel slides  10 reps SAQ's 10 reps SLR's 10 reps ABD Followed by ICE     General Comments        Pertinent Vitals/Pain Pain Location: R knee  Pain Descriptors / Indicators: Aching Pain Intervention(s): Monitored during session;Repositioned;Relaxation;Ice applied    Home Living                      Prior Function            PT Goals (current goals can now be found in the care plan section) Progress towards PT goals: Progressing toward goals    Frequency    7X/week      PT Plan Current plan remains appropriate    Co-evaluation              AM-PAC PT "6 Clicks" Mobility   Outcome Measure  Help needed turning from your back to  your side while in a flat bed without using bedrails?: A Little Help needed moving from lying on your back to sitting on the side of a flat bed without using bedrails?: A Little Help needed moving to and from a bed to a chair (including a wheelchair)?: A Little Help needed standing up from a chair using your arms (e.g., wheelchair or bedside chair)?: A Little Help needed to walk in hospital room?: A Little Help needed climbing 3-5 steps with a railing? : A Little 6 Click Score: 18    End of Session Equipment Utilized During Treatment: Gait belt Activity Tolerance: Patient tolerated treatment well Patient left: in chair;with chair alarm set;with call bell/phone within reach;with family/visitor present;with SCD's reapplied   PT Visit Diagnosis: Difficulty in walking, not elsewhere classified (R26.2);Other abnormalities of gait and mobility (R26.89)      Time: 9233-0076 PT Time Calculation (min) (ACUTE ONLY): 45 min  Charges:  $Gait Training: 8-22 mins $Therapeutic Exercise: 8-22 mins $Therapeutic Activity: 8-22 mins                     Rica Koyanagi  PTA Acute  Rehabilitation Services Pager      [phone] Office      [phone]

## 2018-05-10 NOTE — Progress Notes (Signed)
Physical Therapy Treatment Patient Details Name: Sylvia Sparks MRN: [id number] DOB: [birth year] Today's Date: 05/10/2018    History of Present Illness 73 yo female s/p R TKR on 05/09/18. PMH includes colonic polyps s/p polypectomy, HTN.     PT Comments    POD # 1 pm session Assisted with amb a greater distance, practiced stairs.  Addressed all mobility questions, discussed appropriate activity, educated on use of ICE.  Pt ready for D/C to home.   Follow Up Recommendations  Follow surgeon's recommendation for DC plan and follow-up therapies;Supervision for mobility/OOB(OP)     Equipment Recommendations  Rolling walker with 5" wheels;3in1 (PT)    Recommendations for Other Services       Precautions / Restrictions Precautions Precautions: Fall Restrictions Weight Bearing Restrictions: No Other Position/Activity Restrictions: WBAT     Mobility  Bed Mobility Overal bed mobility: Needs Assistance Bed Mobility: Supine to Sit     Supine to sit: Min assist;HOB elevated     General bed mobility comments: Pt OOB in recliner   Transfers Overall transfer level: Needs assistance Equipment used: Rolling walker (2 wheeled) Transfers: Sit to/from [facility] Sit to Stand: Supervision;Min guard Stand pivot transfers: Supervision;Min guard       General transfer comment: 25% VC's safety with turns  Ambulation/Gait Ambulation/Gait assistance: Min guard;Supervision Gait Distance (Feet): 75 Feet Assistive device: Rolling walker (2 wheeled) Gait Pattern/deviations: Step-to pattern;Decreased weight shift to right Gait velocity: decreased   General Gait Details: 25% VC's on proper walker to self distance and upright posture   Stairs Stairs: Yes Stairs assistance: Min guard;Supervision Stair Management: One rail Right;Step to pattern;Forwards;Sideways Number of Stairs: 3 General stair comments: with sister present for "hands on" instruction    Wheelchair  Mobility    Modified Rankin (Stroke Patients Only)       Balance                                            Cognition Arousal/Alertness: Awake/alert Behavior During Therapy: WFL for tasks assessed/performed Overall Cognitive Status: Within Functional Limits for tasks assessed                                        Exercises      General Comments        Pertinent Vitals/Pain Pain Location: R knee  Pain Descriptors / Indicators: Aching Pain Intervention(s): Monitored during session;Repositioned;Relaxation;Ice applied    Home Living                      Prior Function            PT Goals (current goals can now be found in the care plan section) Progress towards PT goals: Progressing toward goals    Frequency    7X/week      PT Plan Current plan remains appropriate    Co-evaluation              AM-PAC PT "6 Clicks" Mobility   Outcome Measure  Help needed turning from your back to your side while in a flat bed without using bedrails?: A Little Help needed moving from lying on your back to sitting on the side of a flat bed without using bedrails?: A Little Help needed moving to  and from a bed to a chair (including a wheelchair)?: A Little Help needed standing up from a chair using your arms (e.g., wheelchair or bedside chair)?: A Little Help needed to walk in hospital room?: A Little Help needed climbing 3-5 steps with a railing? : A Little 6 Click Score: 18    End of Session Equipment Utilized During Treatment: Gait belt Activity Tolerance: Patient tolerated treatment well Patient left: in chair;with chair alarm set;with call bell/phone within reach;with family/visitor present;with SCD's reapplied   PT Visit Diagnosis: Difficulty in walking, not elsewhere classified (R26.2);Other abnormalities of gait and mobility (R26.89)     Time: 1310-1345 PT Time Calculation (min) (ACUTE ONLY): 35 min  Charges:   $Gait Training: 8-22 mins $Therapeutic Activity: 8-22 mins                     Rica Koyanagi  PTA Acute  Rehabilitation Services Pager      [phone] Office      [phone]

## 2018-05-10 NOTE — Plan of Care (Signed)
Plan of care reviewed and discussed with the patient and her sister. Questions answered to patient's satisfaction

## 2018-05-10 NOTE — Progress Notes (Signed)
     Subjective: 1 Day Post-Op Procedure(s) (LRB): TOTAL KNEE ARTHROPLASTY (Right)   Patient reports pain as mild, pain controlled.  Feels that she worked well with PT yesterday.  Looking forward to improving.  No events throughout the night, except for not sleeping well.  Ready to be discharged home if she does well with PT.    Patient's anticipated LOS is less than 2 midnights, meeting these requirements: - Lives within 1 hour of care - Has a competent adult at home to recover with post-op recover - NO history of  - Chronic pain requiring opiods  - Diabetes  - Coronary Artery Disease  - Heart failure  - Heart attack  - Stroke  - DVT/VTE  - Cardiac arrhythmia  - Respiratory Failure/COPD  - Renal failure  - Anemia  - Advanced Liver disease    Objective:   VITALS:   Vitals:   05/10/18 0549 05/10/18 0815  BP: 116/74 120/67  Pulse: 61 66  Resp: 14   Temp: 97.9 F (36.6 C)   SpO2: 99%     Dorsiflexion/Plantar flexion intact Incision: dressing C/D/I No cellulitis present Compartment soft  LABS Recent Labs    05/10/18 0351  HGB 10.3*  HCT 32.7*  WBC 6.1  PLT 240    Recent Labs    05/10/18 0351  NA 139  K 4.0  BUN 17  CREATININE 0.98  GLUCOSE 218*     Assessment/Plan: 1 Day Post-Op Procedure(s) (LRB): TOTAL KNEE ARTHROPLASTY (Right) Foley cath d/c'ed Advance diet Up with therapy D/C IV fluids Discharge home  Follow up in 2 weeks at [facility] ([place] Follow up with OLIN,Zarion Oliff D in 2 weeks.  Contact information:  [facility]) [address], [place] [phone]    Overweight (BMI 25-29.9) Estimated body mass index is 26.37 kg/m as calculated from the following:   Height as of this encounter: 5\' 6"  (1.676 m).   Weight as of this encounter: 74.1 kg. Patient also counseled that weight may inhibit the healing process Patient counseled that losing  weight will help with future health issues        West Pugh. Neda Willenbring   PAC  05/10/2018, 9:48 AM

## 2018-05-11 ENCOUNTER — Encounter (HOSPITAL_COMMUNITY): Payer: Self-pay | Admitting: Orthopedic Surgery

## 2018-05-12 DIAGNOSIS — M25461 Effusion, right knee: Secondary | ICD-10-CM | POA: Diagnosis not present

## 2018-05-12 DIAGNOSIS — M25661 Stiffness of right knee, not elsewhere classified: Secondary | ICD-10-CM | POA: Diagnosis not present

## 2018-05-12 DIAGNOSIS — Z96651 Presence of right artificial knee joint: Secondary | ICD-10-CM | POA: Diagnosis not present

## 2018-05-12 DIAGNOSIS — M25561 Pain in right knee: Secondary | ICD-10-CM | POA: Diagnosis not present

## 2018-05-15 ENCOUNTER — Encounter: Payer: Self-pay | Admitting: Internal Medicine

## 2018-05-15 DIAGNOSIS — Z96651 Presence of right artificial knee joint: Secondary | ICD-10-CM | POA: Diagnosis not present

## 2018-05-15 DIAGNOSIS — M25661 Stiffness of right knee, not elsewhere classified: Secondary | ICD-10-CM | POA: Diagnosis not present

## 2018-05-15 DIAGNOSIS — M25561 Pain in right knee: Secondary | ICD-10-CM | POA: Diagnosis not present

## 2018-05-15 DIAGNOSIS — M25461 Effusion, right knee: Secondary | ICD-10-CM | POA: Diagnosis not present

## 2018-05-16 DIAGNOSIS — Z96651 Presence of right artificial knee joint: Secondary | ICD-10-CM | POA: Diagnosis not present

## 2018-05-16 DIAGNOSIS — M25661 Stiffness of right knee, not elsewhere classified: Secondary | ICD-10-CM | POA: Diagnosis not present

## 2018-05-16 DIAGNOSIS — M25461 Effusion, right knee: Secondary | ICD-10-CM | POA: Diagnosis not present

## 2018-05-16 DIAGNOSIS — M25561 Pain in right knee: Secondary | ICD-10-CM | POA: Diagnosis not present

## 2018-05-16 NOTE — Discharge Summary (Signed)
Physician Discharge Summary  Patient ID: Sylvia Sparks MRN: [id number] DOB/AGE: [birth year] 73 y.o.  Admit date: 05/09/2018 Discharge date: 05/10/2018   Procedures:  Procedure(s) (LRB): TOTAL KNEE ARTHROPLASTY (Right)  Attending Physician:  Dr. Paralee Cancel   Admission Diagnoses:   Right knee primary OA / pain  Discharge Diagnoses:  Principal Problem:   S/P right TKA Active Problems:   Overweight (BMI 25.0-29.9)  Past Medical History:  Diagnosis Date  . Anemia   . Asthma   . Diabetes mellitus without complication ([place])   . Dyspnea    with pneumonia  . Hypercholesteremia   . Hypertension   . OA (osteoarthritis)   . Pneumonia     HPI:    Sylvia Sparks, 73 y.o. female, has a history of pain and functional disability in the right knee due to arthritis and has failed non-surgical conservative treatments for greater than 12 weeks to include NSAID's and/or analgesics, corticosteriod injections, viscosupplementation injections, use of assistive devices and activity modification.  Onset of symptoms was gradual, starting >10 years ago with gradually worsening course since that time. The patient noted no past surgery on the right knee(s).  Patient currently rates pain in the right knee(s) at 10 out of 10 with activity. Patient has worsening of pain with activity and weight bearing, pain that interferes with activities of daily living, pain with passive range of motion, crepitus and joint swelling.  Patient has evidence of periarticular osteophytes and joint space narrowing by imaging studies.  There is no active infection.  Risks, benefits and expectations were discussed with the patient.  Risks including but not limited to the risk of anesthesia, blood clots, nerve damage, blood vessel damage, failure of the prosthesis, infection and up to and including death.  Patient understand the risks, benefits and expectations and wishes to proceed with surgery.   PCP: Celene Squibb, MD    Discharged Condition: good  Hospital Course:  Patient underwent the above stated procedure on 05/09/2018. Patient tolerated the procedure well and brought to the recovery room in good condition and subsequently to the floor.  POD #1 BP: 120/67 ; Pulse: 66 ; Temp: 97.9 F (36.6 C) ; Resp: 14 Patient reports pain as mild, pain controlled.  Feels that she worked well with PT yesterday.  Looking forward to improving.  No events throughout the night, except for not sleeping well.  Ready to be discharged home. Dorsiflexion/plantar flexion intact, incision: dressing C/D/I, no cellulitis present and compartment soft.   LABS  Basename    HGB     10.3  HCT     32.7    Discharge Exam: General appearance: alert, cooperative and no distress Extremities: Homans sign is negative, no sign of DVT, no edema, redness or tenderness in the calves or thighs and no ulcers, gangrene or trophic changes  Disposition:  Home with follow up in 2 weeks   Follow-up Information    [facility] [place]. Go on 05/12/2018.   Why:  You have a physical therapy appointment on 05-12-2018 at 11:00 am       Danae Orleans, [place]. Go on 05/24/2018.   Specialty:  Orthopedic Surgery Why:  Postop appointment scheduled on 05-24-2018 at 10:15 am. Contact information: [address] [place] [address] [phone]           Discharge Instructions    Call MD / Call 911   Complete by:  As directed    If you experience chest  pain or shortness of breath, CALL 911 and be transported to the hospital emergency room.  If you develope a fever above 101 F, pus (white drainage) or increased drainage or redness at the wound, or calf pain, call your surgeon's office.   Change dressing   Complete by:  As directed    Maintain surgical dressing until follow up in the clinic. If the edges start to pull up, may reinforce with tape. If the dressing is no longer working, may remove and cover  with gauze and tape, but must keep the area dry and clean.  Call with any questions or concerns.   Constipation Prevention   Complete by:  As directed    Drink plenty of fluids.  Prune juice may be helpful.  You may use a stool softener, such as Colace (over the counter) 100 mg twice a day.  Use MiraLax (over the counter) for constipation as needed.   Diet - low sodium heart healthy   Complete by:  As directed    Discharge instructions   Complete by:  As directed    Maintain surgical dressing until follow up in the clinic. If the edges start to pull up, may reinforce with tape. If the dressing is no longer working, may remove and cover with gauze and tape, but must keep the area dry and clean.  Follow up in 2 weeks at [facility]. Call with any questions or concerns.   Increase activity slowly as tolerated   Complete by:  As directed    Weight bearing as tolerated with assist device (walker, cane, etc) as directed, use it as long as suggested by your surgeon or therapist, typically at least 4-6 weeks.   TED hose   Complete by:  As directed    Use stockings (TED hose) for 2 weeks on both leg(s).  You may remove them at night for sleeping.      Allergies as of 05/10/2018   No Known Allergies     Medication List    STOP taking these medications   acetaminophen 650 MG CR tablet Commonly known as:  TYLENOL   aspirin 81 MG tablet Replaced by:  aspirin 81 MG chewable tablet   ibuprofen 600 MG tablet Commonly known as:  ADVIL,MOTRIN     TAKE these medications   albuterol 108 (90 Base) MCG/ACT inhaler Commonly known as:  PROVENTIL HFA;VENTOLIN HFA Inhale 2 puffs into the lungs every 6 (six) hours as needed for wheezing or shortness of breath.   amLODipine 10 MG tablet Commonly known as:  NORVASC Take 10 mg by mouth daily.   aspirin 81 MG chewable tablet Commonly known as:  ASPIRIN CHILDRENS Chew 1 tablet (81 mg total) by mouth 2 (two) times daily for 30 days. Take for  4 weeks, then resume regular dose. Replaces:  aspirin 81 MG tablet   carbonyl iron 45 MG Tabs tablet Commonly known as:  FEOSOL Take 45 mg by mouth daily.   docusate sodium 100 MG capsule Commonly known as:  COLACE Take 1 capsule (100 mg total) by mouth 2 (two) times daily.   fexofenadine-pseudoephedrine 180-240 MG 24 hr tablet Commonly known as:  ALLEGRA-D 24 Take 1 tablet by mouth as needed.   glipiZIDE 5 MG 24 hr tablet Commonly known as:  GLUCOTROL XL Take 5 mg by mouth daily.   HYDROcodone-acetaminophen 7.5-325 MG tablet Commonly known as:  NORCO Take 1-2 tablets by mouth every 4 (four) hours as needed for moderate pain.  HYDROcodone-acetaminophen 7.5-325 MG tablet Commonly known as:  NORCO Take 1-2 tablets by mouth every 4 (four) hours as needed for moderate pain.   Krill Oil 1000 MG Caps Take 1,000 mg by mouth daily.   lovastatin 20 MG tablet Commonly known as:  MEVACOR Take 20 mg by mouth at bedtime.   methocarbamol 500 MG tablet Commonly known as:  ROBAXIN Take 1 tablet (500 mg total) by mouth every 6 (six) hours as needed for muscle spasms.   multivitamins ther. w/minerals Tabs tablet Take 2 tablets by mouth daily.   polyethylene glycol packet Commonly known as:  MIRALAX / GLYCOLAX Take 17 g by mouth 2 (two) times daily.   polyethylene glycol-electrolytes 420 g solution Commonly known as:  TRILYTE Take 4,000 mLs by mouth as directed.   potassium chloride 10 MEQ tablet Commonly known as:  K-DUR Take 10 mEq by mouth 2 (two) times daily.   SALONPAS PAIN RELIEF PATCH EX Apply topically as needed.   valsartan 160 MG tablet Commonly known as:  DIOVAN Take 160 mg by mouth daily.            Discharge Care Instructions  (From admission, onward)         Start     Ordered   05/10/18 0000  Change dressing    Comments:  Maintain surgical dressing until follow up in the clinic. If the edges start to pull up, may reinforce with tape. If the dressing  is no longer working, may remove and cover with gauze and tape, but must keep the area dry and clean.  Call with any questions or concerns.   05/10/18 0349           Signed: West Pugh. Morse Brueggemann   PA-C  05/16/2018, 8:25 AM

## 2018-05-18 DIAGNOSIS — M25661 Stiffness of right knee, not elsewhere classified: Secondary | ICD-10-CM | POA: Diagnosis not present

## 2018-05-18 DIAGNOSIS — M25561 Pain in right knee: Secondary | ICD-10-CM | POA: Diagnosis not present

## 2018-05-18 DIAGNOSIS — M25461 Effusion, right knee: Secondary | ICD-10-CM | POA: Diagnosis not present

## 2018-05-18 DIAGNOSIS — Z96651 Presence of right artificial knee joint: Secondary | ICD-10-CM | POA: Diagnosis not present

## 2018-05-23 DIAGNOSIS — M25561 Pain in right knee: Secondary | ICD-10-CM | POA: Diagnosis not present

## 2018-05-23 DIAGNOSIS — M25461 Effusion, right knee: Secondary | ICD-10-CM | POA: Diagnosis not present

## 2018-05-23 DIAGNOSIS — M25661 Stiffness of right knee, not elsewhere classified: Secondary | ICD-10-CM | POA: Diagnosis not present

## 2018-05-23 DIAGNOSIS — Z96651 Presence of right artificial knee joint: Secondary | ICD-10-CM | POA: Diagnosis not present

## 2018-05-25 DIAGNOSIS — M25461 Effusion, right knee: Secondary | ICD-10-CM | POA: Diagnosis not present

## 2018-05-25 DIAGNOSIS — M25561 Pain in right knee: Secondary | ICD-10-CM | POA: Diagnosis not present

## 2018-05-25 DIAGNOSIS — M25661 Stiffness of right knee, not elsewhere classified: Secondary | ICD-10-CM | POA: Diagnosis not present

## 2018-05-25 DIAGNOSIS — Z96651 Presence of right artificial knee joint: Secondary | ICD-10-CM | POA: Diagnosis not present

## 2018-05-26 DIAGNOSIS — M25661 Stiffness of right knee, not elsewhere classified: Secondary | ICD-10-CM | POA: Diagnosis not present

## 2018-05-26 DIAGNOSIS — M25561 Pain in right knee: Secondary | ICD-10-CM | POA: Diagnosis not present

## 2018-05-26 DIAGNOSIS — Z96651 Presence of right artificial knee joint: Secondary | ICD-10-CM | POA: Diagnosis not present

## 2018-05-26 DIAGNOSIS — M25461 Effusion, right knee: Secondary | ICD-10-CM | POA: Diagnosis not present

## 2018-05-30 DIAGNOSIS — M25661 Stiffness of right knee, not elsewhere classified: Secondary | ICD-10-CM | POA: Diagnosis not present

## 2018-05-30 DIAGNOSIS — M25561 Pain in right knee: Secondary | ICD-10-CM | POA: Diagnosis not present

## 2018-05-30 DIAGNOSIS — M25461 Effusion, right knee: Secondary | ICD-10-CM | POA: Diagnosis not present

## 2018-05-30 DIAGNOSIS — Z96651 Presence of right artificial knee joint: Secondary | ICD-10-CM | POA: Diagnosis not present

## 2018-06-02 DIAGNOSIS — Z96651 Presence of right artificial knee joint: Secondary | ICD-10-CM | POA: Diagnosis not present

## 2018-06-02 DIAGNOSIS — M25461 Effusion, right knee: Secondary | ICD-10-CM | POA: Diagnosis not present

## 2018-06-02 DIAGNOSIS — M25561 Pain in right knee: Secondary | ICD-10-CM | POA: Diagnosis not present

## 2018-06-02 DIAGNOSIS — M25661 Stiffness of right knee, not elsewhere classified: Secondary | ICD-10-CM | POA: Diagnosis not present

## 2018-06-06 DIAGNOSIS — M25561 Pain in right knee: Secondary | ICD-10-CM | POA: Diagnosis not present

## 2018-06-06 DIAGNOSIS — Z96651 Presence of right artificial knee joint: Secondary | ICD-10-CM | POA: Diagnosis not present

## 2018-06-06 DIAGNOSIS — M25461 Effusion, right knee: Secondary | ICD-10-CM | POA: Diagnosis not present

## 2018-06-06 DIAGNOSIS — M25661 Stiffness of right knee, not elsewhere classified: Secondary | ICD-10-CM | POA: Diagnosis not present

## 2018-06-08 DIAGNOSIS — M25661 Stiffness of right knee, not elsewhere classified: Secondary | ICD-10-CM | POA: Diagnosis not present

## 2018-06-08 DIAGNOSIS — Z96651 Presence of right artificial knee joint: Secondary | ICD-10-CM | POA: Diagnosis not present

## 2018-06-08 DIAGNOSIS — M25461 Effusion, right knee: Secondary | ICD-10-CM | POA: Diagnosis not present

## 2018-06-08 DIAGNOSIS — M25561 Pain in right knee: Secondary | ICD-10-CM | POA: Diagnosis not present

## 2018-06-09 DIAGNOSIS — M25461 Effusion, right knee: Secondary | ICD-10-CM | POA: Diagnosis not present

## 2018-06-09 DIAGNOSIS — Z96651 Presence of right artificial knee joint: Secondary | ICD-10-CM | POA: Diagnosis not present

## 2018-06-09 DIAGNOSIS — M25661 Stiffness of right knee, not elsewhere classified: Secondary | ICD-10-CM | POA: Diagnosis not present

## 2018-06-09 DIAGNOSIS — M25561 Pain in right knee: Secondary | ICD-10-CM | POA: Diagnosis not present

## 2018-06-13 DIAGNOSIS — M25461 Effusion, right knee: Secondary | ICD-10-CM | POA: Diagnosis not present

## 2018-06-13 DIAGNOSIS — M25561 Pain in right knee: Secondary | ICD-10-CM | POA: Diagnosis not present

## 2018-06-13 DIAGNOSIS — M25661 Stiffness of right knee, not elsewhere classified: Secondary | ICD-10-CM | POA: Diagnosis not present

## 2018-06-13 DIAGNOSIS — Z96651 Presence of right artificial knee joint: Secondary | ICD-10-CM | POA: Diagnosis not present

## 2018-06-15 DIAGNOSIS — M25461 Effusion, right knee: Secondary | ICD-10-CM | POA: Diagnosis not present

## 2018-06-15 DIAGNOSIS — Z96651 Presence of right artificial knee joint: Secondary | ICD-10-CM | POA: Diagnosis not present

## 2018-06-15 DIAGNOSIS — M25661 Stiffness of right knee, not elsewhere classified: Secondary | ICD-10-CM | POA: Diagnosis not present

## 2018-06-15 DIAGNOSIS — M25561 Pain in right knee: Secondary | ICD-10-CM | POA: Diagnosis not present

## 2018-06-20 DIAGNOSIS — M25461 Effusion, right knee: Secondary | ICD-10-CM | POA: Diagnosis not present

## 2018-06-20 DIAGNOSIS — M25661 Stiffness of right knee, not elsewhere classified: Secondary | ICD-10-CM | POA: Diagnosis not present

## 2018-06-20 DIAGNOSIS — Z96651 Presence of right artificial knee joint: Secondary | ICD-10-CM | POA: Diagnosis not present

## 2018-06-20 DIAGNOSIS — M25561 Pain in right knee: Secondary | ICD-10-CM | POA: Diagnosis not present

## 2018-06-21 DIAGNOSIS — Z471 Aftercare following joint replacement surgery: Secondary | ICD-10-CM | POA: Diagnosis not present

## 2018-06-21 DIAGNOSIS — Z96651 Presence of right artificial knee joint: Secondary | ICD-10-CM | POA: Diagnosis not present

## 2018-06-21 DIAGNOSIS — M1712 Unilateral primary osteoarthritis, left knee: Secondary | ICD-10-CM | POA: Diagnosis not present

## 2018-06-22 DIAGNOSIS — M25661 Stiffness of right knee, not elsewhere classified: Secondary | ICD-10-CM | POA: Diagnosis not present

## 2018-06-22 DIAGNOSIS — Z96651 Presence of right artificial knee joint: Secondary | ICD-10-CM | POA: Diagnosis not present

## 2018-06-22 DIAGNOSIS — M25561 Pain in right knee: Secondary | ICD-10-CM | POA: Diagnosis not present

## 2018-06-22 DIAGNOSIS — M25461 Effusion, right knee: Secondary | ICD-10-CM | POA: Diagnosis not present

## 2018-06-27 DIAGNOSIS — Z96651 Presence of right artificial knee joint: Secondary | ICD-10-CM | POA: Diagnosis not present

## 2018-06-27 DIAGNOSIS — M25661 Stiffness of right knee, not elsewhere classified: Secondary | ICD-10-CM | POA: Diagnosis not present

## 2018-06-27 DIAGNOSIS — M25561 Pain in right knee: Secondary | ICD-10-CM | POA: Diagnosis not present

## 2018-06-27 DIAGNOSIS — M25461 Effusion, right knee: Secondary | ICD-10-CM | POA: Diagnosis not present

## 2018-06-29 DIAGNOSIS — M25461 Effusion, right knee: Secondary | ICD-10-CM | POA: Diagnosis not present

## 2018-06-29 DIAGNOSIS — M25661 Stiffness of right knee, not elsewhere classified: Secondary | ICD-10-CM | POA: Diagnosis not present

## 2018-06-29 DIAGNOSIS — M25561 Pain in right knee: Secondary | ICD-10-CM | POA: Diagnosis not present

## 2018-06-29 DIAGNOSIS — Z96651 Presence of right artificial knee joint: Secondary | ICD-10-CM | POA: Diagnosis not present

## 2018-07-04 DIAGNOSIS — M25661 Stiffness of right knee, not elsewhere classified: Secondary | ICD-10-CM | POA: Diagnosis not present

## 2018-07-04 DIAGNOSIS — M25561 Pain in right knee: Secondary | ICD-10-CM | POA: Diagnosis not present

## 2018-07-04 DIAGNOSIS — Z96651 Presence of right artificial knee joint: Secondary | ICD-10-CM | POA: Diagnosis not present

## 2018-07-04 DIAGNOSIS — M25461 Effusion, right knee: Secondary | ICD-10-CM | POA: Diagnosis not present

## 2018-08-21 DIAGNOSIS — E782 Mixed hyperlipidemia: Secondary | ICD-10-CM | POA: Diagnosis not present

## 2018-08-21 DIAGNOSIS — M1711 Unilateral primary osteoarthritis, right knee: Secondary | ICD-10-CM | POA: Diagnosis not present

## 2018-08-21 DIAGNOSIS — N183 Chronic kidney disease, stage 3 (moderate): Secondary | ICD-10-CM | POA: Diagnosis not present

## 2018-08-21 DIAGNOSIS — I129 Hypertensive chronic kidney disease with stage 1 through stage 4 chronic kidney disease, or unspecified chronic kidney disease: Secondary | ICD-10-CM | POA: Diagnosis not present

## 2018-08-21 DIAGNOSIS — E1122 Type 2 diabetes mellitus with diabetic chronic kidney disease: Secondary | ICD-10-CM | POA: Diagnosis not present

## 2018-10-18 DIAGNOSIS — M1712 Unilateral primary osteoarthritis, left knee: Secondary | ICD-10-CM | POA: Diagnosis not present

## 2018-11-22 ENCOUNTER — Other Ambulatory Visit: Payer: Self-pay

## 2018-11-29 ENCOUNTER — Encounter: Payer: Self-pay | Admitting: Internal Medicine

## 2018-12-14 DIAGNOSIS — I10 Essential (primary) hypertension: Secondary | ICD-10-CM | POA: Diagnosis not present

## 2018-12-14 DIAGNOSIS — N183 Chronic kidney disease, stage 3 (moderate): Secondary | ICD-10-CM | POA: Diagnosis not present

## 2018-12-14 DIAGNOSIS — E782 Mixed hyperlipidemia: Secondary | ICD-10-CM | POA: Diagnosis not present

## 2018-12-14 DIAGNOSIS — E1122 Type 2 diabetes mellitus with diabetic chronic kidney disease: Secondary | ICD-10-CM | POA: Diagnosis not present

## 2018-12-19 DIAGNOSIS — M1711 Unilateral primary osteoarthritis, right knee: Secondary | ICD-10-CM | POA: Diagnosis not present

## 2018-12-19 DIAGNOSIS — E1122 Type 2 diabetes mellitus with diabetic chronic kidney disease: Secondary | ICD-10-CM | POA: Diagnosis not present

## 2018-12-19 DIAGNOSIS — E782 Mixed hyperlipidemia: Secondary | ICD-10-CM | POA: Diagnosis not present

## 2018-12-19 DIAGNOSIS — M1712 Unilateral primary osteoarthritis, left knee: Secondary | ICD-10-CM | POA: Diagnosis not present

## 2018-12-19 DIAGNOSIS — N390 Urinary tract infection, site not specified: Secondary | ICD-10-CM | POA: Diagnosis not present

## 2018-12-19 DIAGNOSIS — E663 Overweight: Secondary | ICD-10-CM | POA: Diagnosis not present

## 2018-12-19 DIAGNOSIS — Z6827 Body mass index (BMI) 27.0-27.9, adult: Secondary | ICD-10-CM | POA: Diagnosis not present

## 2018-12-19 DIAGNOSIS — N183 Chronic kidney disease, stage 3 (moderate): Secondary | ICD-10-CM | POA: Diagnosis not present

## 2018-12-19 DIAGNOSIS — I129 Hypertensive chronic kidney disease with stage 1 through stage 4 chronic kidney disease, or unspecified chronic kidney disease: Secondary | ICD-10-CM | POA: Diagnosis not present

## 2019-01-02 DIAGNOSIS — J060 Acute laryngopharyngitis: Secondary | ICD-10-CM | POA: Diagnosis not present

## 2019-01-02 DIAGNOSIS — I129 Hypertensive chronic kidney disease with stage 1 through stage 4 chronic kidney disease, or unspecified chronic kidney disease: Secondary | ICD-10-CM | POA: Diagnosis not present

## 2019-01-02 DIAGNOSIS — N182 Chronic kidney disease, stage 2 (mild): Secondary | ICD-10-CM | POA: Diagnosis not present

## 2019-01-02 DIAGNOSIS — M1711 Unilateral primary osteoarthritis, right knee: Secondary | ICD-10-CM | POA: Diagnosis not present

## 2019-01-02 DIAGNOSIS — E663 Overweight: Secondary | ICD-10-CM | POA: Diagnosis not present

## 2019-01-02 DIAGNOSIS — N390 Urinary tract infection, site not specified: Secondary | ICD-10-CM | POA: Diagnosis not present

## 2019-01-02 DIAGNOSIS — Z23 Encounter for immunization: Secondary | ICD-10-CM | POA: Diagnosis not present

## 2019-01-02 DIAGNOSIS — I10 Essential (primary) hypertension: Secondary | ICD-10-CM | POA: Diagnosis not present

## 2019-01-02 DIAGNOSIS — E782 Mixed hyperlipidemia: Secondary | ICD-10-CM | POA: Diagnosis not present

## 2019-01-02 DIAGNOSIS — N183 Chronic kidney disease, stage 3 (moderate): Secondary | ICD-10-CM | POA: Diagnosis not present

## 2019-01-02 DIAGNOSIS — H6502 Acute serous otitis media, left ear: Secondary | ICD-10-CM | POA: Diagnosis not present

## 2019-01-02 DIAGNOSIS — Z6826 Body mass index (BMI) 26.0-26.9, adult: Secondary | ICD-10-CM | POA: Diagnosis not present

## 2019-01-17 ENCOUNTER — Encounter: Payer: Self-pay | Admitting: *Deleted

## 2019-01-17 ENCOUNTER — Ambulatory Visit (INDEPENDENT_AMBULATORY_CARE_PROVIDER_SITE_OTHER): Payer: Medicare Other | Admitting: *Deleted

## 2019-01-17 ENCOUNTER — Other Ambulatory Visit: Payer: Self-pay

## 2019-01-17 DIAGNOSIS — Z86010 Personal history of colonic polyps: Secondary | ICD-10-CM

## 2019-01-17 MED ORDER — NA SULFATE-K SULFATE-MG SULF 17.5-3.13-1.6 GM/177ML PO SOLN: 1.0000 | Freq: Once | ORAL | 0 refills | Status: AC

## 2019-01-17 NOTE — Progress Notes (Signed)
Pt remembered that she was taking ASA 81 mg when questioned about blood thinners.  Added to medication list.

## 2019-01-17 NOTE — Progress Notes (Addendum)
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 Diabetes Melitis: No, hx of diabetes 2. Joint replacements in the past 12 months: Yes, rt knee 3. Major health problems in the past 3 months: No 4. Has an artificial valve or MVP: No 5. Has a defibrillator: No 6. Has been advised in past to take antibiotics in advance of a procedure like teeth cleaning: No 7. Family history of colon cancer:  No 8. Alcohol Use: Yes, 1 glass of wine a month 9. Illicit drug Use: No 10. History of sleep apnea: No  11. History of coronary artery or other vascular stents placed within the last 12 months: No 12. History of any prior anesthesia complications: No 13. There is no height or weight on file to calculate BMI. Ht: 5'6 1/2 Wt: 163 lbs    MEDICATIONS & ALLERGIES:    Patient reports the following regarding taking any blood thinners:   Plavix? No Aspirin? Yes Coumadin? No Brilinta? No Xarelto? No Eliquis? No Pradaxa? No Savaysa? No Effient? No  Patient confirms/reports the following medications:  Current Outpatient Medications  Medication Sig Dispense Refill  . albuterol (PROVENTIL HFA;VENTOLIN HFA) 108 (90 Base) MCG/ACT inhaler Inhale 2 puffs into the lungs as needed for wheezing or shortness of breath.     Marland Kitchen amLODipine (NORVASC) 10 MG tablet Take 10 mg by mouth daily.    . carbonyl iron (FEOSOL) 45 MG TABS tablet Take 45 mg by mouth daily.    . Chlorpheniramine-Acetaminophen (CORICIDIN HBP COLD/FLU PO) Take by mouth as needed.    Marland Kitchen ibuprofen (ADVIL) 600 MG tablet Take 600 mg by mouth 2 (two) times daily.     Javier Docker Oil 1000 MG CAPS Take 1,000 mg by mouth daily.    . Liniments (SALONPAS PAIN RELIEF PATCH EX) Apply topically as needed.    . lovastatin (MEVACOR) 20 MG tablet Take 10  mg by mouth at bedtime.     . Multiple Vitamins-Minerals (MULTIVITAMINS THER. W/MINERALS) TABS tablet Take 2 tablets by mouth daily.     . potassium chloride (K-DUR) 10 MEQ tablet Take 10 mEq by mouth 2 (two) times daily.    . valsartan (DIOVAN) 160 MG tablet Take 160 mg by mouth daily.     No current facility-administered medications for this visit.     Patient confirms/reports the following allergies:  No Known Allergies  No orders of the defined types were placed in this encounter.   AUTHORIZATION INFORMATION Primary Insurance: Medicare,  ID #: [id number] Pre-Cert / Auth required: No, not required  Secondary Insurance: [employer] [place] Supplemental ,  ID [id number],  Group#: [id number] Pre-Cert / Auth required: No, not required per CC Pre-Cert / Auth #: [id number]  SCHEDULE INFORMATION: Procedure has been scheduled as follows:  Date: 03/07/2019, Time: 1:00 Location: [facility] with Dr. Gala Romney  This Gastroenterology Pre-Precedure Review Form is being routed to the following provider(s): Walden Field, NP

## 2019-01-17 NOTE — Patient Instructions (Addendum)
Sylvia Sparks   [birth year] MRN: [id number]    Procedure Date: 03/07/2019 Time to register: 12:00 pm Place to register: [facility] Procedure Time: 1:00 pm Scheduled provider: Dr. Gala Romney  PREPARATION FOR COLONOSCOPY WITH TRI-LYTE SPLIT PREP  Please notify us immediately if you are diabetic, take iron supplements, or if you are on Coumadin or any other blood thinners.   Please hold the following medications:  Do not take Iron 1 week before procedure.  You will need to purchase 1 fleet enema and 1 box of Bisacodyl '5mg'$  tablets.   2 DAYS BEFORE PROCEDURE:  DATE: 03/05/2019  DAY: Monday Begin clear liquid diet AFTER your lunch meal. NO SOLID FOODS after this point.  1 DAY BEFORE PROCEDURE:  DATE: 03/06/2019   DAY: Tuesday Continue clear liquids the entire day - NO SOLID FOOD.    At 2:00 pm:  Take 2 Bisacodyl tablets.   At 4:00pm:  Start drinking your solution. Make sure you mix well per instructions on the bottle. Try to drink 1 (one) 8 ounce glass every 10-15 minutes until you have consumed HALF the jug. You should complete by 6:00pm.You must keep the left over solution refrigerated until completed next day.  Continue clear liquids. You must drink plenty of clear liquids to prevent dehyration and kidney failure.     DAY OF PROCEDURE:   DATE: 03/07/2019   DAY: Wednesday If you take medications for your heart, blood pressure or breathing, you may take these medications.   Five hours before your procedure time @ 8:00 am:  Finish remaining amout of bowel prep, drinking 1 (one) 8 ounce glass every 10-15 minutes until complete. You have two hours to consume remaining prep.   Three hours before your procedure time @ 10:00 am:  Nothing by mouth.   At least one hour before going to the hospital:  Give yourself one Fleet enema. You may take your morning medications with sip of water unless we have instructed otherwise.      Please see below for Dietary  Information.  CLEAR LIQUIDS INCLUDE:  Water Jello (NOT red in color)   Ice Popsicles (NOT red in color)   Tea (sugar ok, no milk/cream) Powdered fruit flavored drinks  Coffee (sugar ok, no milk/cream) Gatorade/ Lemonade/ Kool-Aid  (NOT red in color)   Juice: apple, white grape, white cranberry Soft drinks  Clear bullion, consomme, broth (fat free beef/chicken/vegetable)  Carbonated beverages (any kind)  Strained chicken noodle soup Hard Candy   Remember: Clear liquids are liquids that will allow you to see your fingers on the other side of a clear glass. Be sure liquids are NOT red in color, and not cloudy, but CLEAR.  DO NOT EAT OR DRINK ANY OF THE FOLLOWING:  Dairy products of any kind   Cranberry juice Tomato juice / V8 juice   Grapefruit juice Orange juice     Red grape juice  Do not eat any solid foods, including such foods as: cereal, oatmeal, yogurt, fruits, vegetables, creamed soups, eggs, bread, crackers, pureed foods in a blender, etc.   HELPFUL HINTS FOR DRINKING PREP SOLUTION:   Make sure prep is extremely cold. Mix and refrigerate the the morning of the prep. You may also put in the freezer.   You may try mixing some Crystal Light or Country Time Lemonade if you prefer. Mix in small amounts; add more if necessary.  Try drinking through a straw  Rinse mouth with water or a mouthwash  between glasses, to remove after-taste.  Try sipping on a cold beverage /ice/ popsicles between glasses of prep.  Place a piece of sugar-free hard candy in mouth between glasses.  If you become nauseated, try consuming smaller amounts, or stretch out the time between glasses. Stop for 30-60 minutes, then slowly start back drinking.        OTHER INSTRUCTIONS  You will need a responsible adult at least 72 years of age to accompany you and drive you home. This person must remain in the waiting room during your procedure. The hospital will cancel your procedure if you do not have a  responsible adult with you.   1. Wear loose fitting clothing that is easily removed. 2. Leave jewelry and other valuables at home.  3. Remove all body piercing jewelry and leave at home. 4. Total time from sign-in until discharge is approximately 2-3 hours. 5. You should go home directly after your procedure and rest. You can resume normal activities the day after your procedure. 6. The day of your procedure you should not:  Drive  Make legal decisions  Operate machinery  Drink alcohol  Return to work   You may call the office (Dept: [phone]) before 5:00pm, or page the doctor on call [phone]) after 5:00pm, for further instructions, if necessary.   Insurance Information YOU WILL NEED TO CHECK WITH YOUR INSURANCE COMPANY FOR THE BENEFITS OF COVERAGE YOU HAVE FOR THIS PROCEDURE.  UNFORTUNATELY, NOT ALL INSURANCE COMPANIES HAVE BENEFITS TO COVER ALL OR PART OF THESE TYPES OF PROCEDURES.  IT IS YOUR RESPONSIBILITY TO CHECK YOUR BENEFITS, HOWEVER, WE WILL BE GLAD TO ASSIST YOU WITH ANY CODES YOUR INSURANCE COMPANY MAY NEED.    PLEASE NOTE THAT MOST INSURANCE COMPANIES WILL NOT COVER A SCREENING COLONOSCOPY FOR PEOPLE UNDER THE AGE OF 50  IF YOU HAVE BCBS INSURANCE, YOU MAY HAVE BENEFITS FOR A SCREENING COLONOSCOPY BUT IF POLYPS ARE FOUND THE DIAGNOSIS WILL CHANGE AND THEN YOU MAY HAVE A DEDUCTIBLE THAT WILL NEED TO BE MET. SO PLEASE MAKE SURE YOU CHECK YOUR BENEFITS FOR A SCREENING COLONOSCOPY AS WELL AS A DIAGNOSTIC COLONOSCOPY.

## 2019-01-17 NOTE — Progress Notes (Signed)
Ok to schedule. Hold iron for 1 week prior to procedure.

## 2019-01-18 MED ORDER — PEG 3350-KCL-NA BICARB-NACL 420 G PO SOLR: 4000.0000 mL | Freq: Once | ORAL | 0 refills | Status: AC

## 2019-01-18 NOTE — Addendum Note (Signed)
Addended by: Metro Kung on: 01/18/2019 12:32 PM   Modules accepted: Orders

## 2019-01-18 NOTE — Addendum Note (Signed)
Addended by: Metro Kung on: 01/18/2019 01:43 PM   Modules accepted: Orders, SmartSet

## 2019-01-18 NOTE — Progress Notes (Signed)
Pt called in and said that the prep was too expensive.  Sent different prep in per pt request.  Pt aware that I am mailing out new prep instructions.

## 2019-01-26 DIAGNOSIS — Z23 Encounter for immunization: Secondary | ICD-10-CM | POA: Diagnosis not present

## 2019-03-05 ENCOUNTER — Other Ambulatory Visit (HOSPITAL_COMMUNITY)
Admission: RE | Admit: 2019-03-05 | Discharge: 2019-03-05 | Disposition: A | Discharge status: Home / Self Care | Payer: Medicare Other | Source: Ambulatory Visit | Attending: Internal Medicine | Admitting: Internal Medicine

## 2019-03-05 ENCOUNTER — Other Ambulatory Visit: Payer: Self-pay

## 2019-03-05 DIAGNOSIS — Z20828 Contact with and (suspected) exposure to other viral communicable diseases: Secondary | ICD-10-CM | POA: Diagnosis not present

## 2019-03-05 DIAGNOSIS — Z01812 Encounter for preprocedural laboratory examination: Secondary | ICD-10-CM | POA: Insufficient documentation

## 2019-03-05 LAB — SARS CORONAVIRUS 2 (TAT 6-24 HRS): SARS Coronavirus 2: NEGATIVE

## 2019-03-07 ENCOUNTER — Other Ambulatory Visit: Payer: Self-pay

## 2019-03-07 ENCOUNTER — Encounter (HOSPITAL_COMMUNITY)
Admission: RE | Disposition: A | Discharge status: Home / Self Care | Payer: Self-pay | Source: Home / Self Care | Attending: Internal Medicine

## 2019-03-07 ENCOUNTER — Ambulatory Visit (HOSPITAL_COMMUNITY)
Admission: RE | Admit: 2019-03-07 | Discharge: 2019-03-07 | Disposition: A | Discharge status: Home / Self Care | Payer: Medicare Other | Attending: Internal Medicine | Admitting: Internal Medicine

## 2019-03-07 ENCOUNTER — Encounter (HOSPITAL_COMMUNITY): Payer: Self-pay | Admitting: *Deleted

## 2019-03-07 DIAGNOSIS — I10 Essential (primary) hypertension: Secondary | ICD-10-CM | POA: Diagnosis not present

## 2019-03-07 DIAGNOSIS — Z79899 Other long term (current) drug therapy: Secondary | ICD-10-CM | POA: Diagnosis not present

## 2019-03-07 DIAGNOSIS — D649 Anemia, unspecified: Secondary | ICD-10-CM | POA: Diagnosis not present

## 2019-03-07 DIAGNOSIS — K5730 Diverticulosis of large intestine without perforation or abscess without bleeding: Secondary | ICD-10-CM | POA: Insufficient documentation

## 2019-03-07 DIAGNOSIS — E7800 Pure hypercholesterolemia, unspecified: Secondary | ICD-10-CM | POA: Insufficient documentation

## 2019-03-07 DIAGNOSIS — D123 Benign neoplasm of transverse colon: Secondary | ICD-10-CM | POA: Diagnosis not present

## 2019-03-07 DIAGNOSIS — Z09 Encounter for follow-up examination after completed treatment for conditions other than malignant neoplasm: Secondary | ICD-10-CM | POA: Insufficient documentation

## 2019-03-07 DIAGNOSIS — K635 Polyp of colon: Secondary | ICD-10-CM | POA: Diagnosis not present

## 2019-03-07 DIAGNOSIS — Z96651 Presence of right artificial knee joint: Secondary | ICD-10-CM | POA: Insufficient documentation

## 2019-03-07 DIAGNOSIS — Z86010 Personal history of colonic polyps: Secondary | ICD-10-CM | POA: Diagnosis not present

## 2019-03-07 DIAGNOSIS — J45909 Unspecified asthma, uncomplicated: Secondary | ICD-10-CM | POA: Insufficient documentation

## 2019-03-07 DIAGNOSIS — Z7984 Long term (current) use of oral hypoglycemic drugs: Secondary | ICD-10-CM | POA: Diagnosis not present

## 2019-03-07 DIAGNOSIS — Z7982 Long term (current) use of aspirin: Secondary | ICD-10-CM | POA: Insufficient documentation

## 2019-03-07 DIAGNOSIS — E119 Type 2 diabetes mellitus without complications: Secondary | ICD-10-CM | POA: Insufficient documentation

## 2019-03-07 HISTORY — PX: COLONOSCOPY: SHX5424

## 2019-03-07 HISTORY — PX: POLYPECTOMY: SHX5525

## 2019-03-07 SURGERY — COLONOSCOPY
Anesthesia: Moderate Sedation

## 2019-03-07 MED ORDER — MEPERIDINE HCL 50 MG/ML IJ SOLN
INTRAMUSCULAR | Status: AC
  Filled 2019-03-07: qty 1

## 2019-03-07 MED ORDER — MIDAZOLAM HCL 5 MG/5ML IJ SOLN
INTRAMUSCULAR | Status: AC
  Filled 2019-03-07: qty 10

## 2019-03-07 MED ORDER — MIDAZOLAM HCL 5 MG/5ML IJ SOLN
INTRAMUSCULAR | Status: DC | PRN
  Administered 2019-03-07: 2 mg via INTRAVENOUS
  Administered 2019-03-07: 1 mg via INTRAVENOUS

## 2019-03-07 MED ORDER — STERILE WATER FOR IRRIGATION IR SOLN
Status: DC | PRN
  Administered 2019-03-07: 2.5 mL

## 2019-03-07 MED ORDER — ONDANSETRON HCL 4 MG/2ML IJ SOLN
INTRAMUSCULAR | Status: AC
  Filled 2019-03-07: qty 2

## 2019-03-07 MED ORDER — MEPERIDINE HCL 100 MG/ML IJ SOLN
INTRAMUSCULAR | Status: DC | PRN
  Administered 2019-03-07: 25 mg via INTRAVENOUS

## 2019-03-07 MED ORDER — SODIUM CHLORIDE 0.9 % IV SOLN
INTRAVENOUS | Status: DC
  Administered 2019-03-07: 13:00:00 1000 mL via INTRAVENOUS

## 2019-03-07 MED ORDER — ONDANSETRON HCL 4 MG/2ML IJ SOLN
INTRAMUSCULAR | Status: DC | PRN
  Administered 2019-03-07: 4 mg via INTRAVENOUS

## 2019-03-07 NOTE — Discharge Instructions (Signed)
Colonoscopy Discharge Instructions  Read the instructions outlined below and refer to this sheet in the next few weeks. These discharge instructions provide you with general information on caring for yourself after you leave the hospital. Your doctor may also give you specific instructions. While your treatment has been planned according to the most current medical practices available, unavoidable complications occasionally occur. If you have any problems or questions after discharge, call Dr. Gala Romney at [phone]. ACTIVITY  You may resume your regular activity, but move at a slower pace for the next 24 hours.   Take frequent rest periods for the next 24 hours.   Walking will help get rid of the air and reduce the bloated feeling in your belly (abdomen).   No driving for 24 hours (because of the medicine (anesthesia) used during the test).    Do not sign any important legal documents or operate any machinery for 24 hours (because of the anesthesia used during the test).  NUTRITION  Drink plenty of fluids.   You may resume your normal diet as instructed by your doctor.   Begin with a light meal and progress to your normal diet. Heavy or fried foods are harder to digest and may make you feel sick to your stomach (nauseated).   Avoid alcoholic beverages for 24 hours or as instructed.  MEDICATIONS  You may resume your normal medications unless your doctor tells you otherwise.  WHAT YOU CAN EXPECT TODAY  Some feelings of bloating in the abdomen.   Passage of more gas than usual.   Spotting of blood in your stool or on the toilet paper.  IF YOU HAD POLYPS REMOVED DURING THE COLONOSCOPY:  No aspirin products for 7 days or as instructed.   No alcohol for 7 days or as instructed.   Eat a soft diet for the next 24 hours.  FINDING OUT THE RESULTS OF YOUR TEST Not all test results are available during your visit. If your test results are not back during the visit, make an appointment  with your caregiver to find out the results. Do not assume everything is normal if you have not heard from your caregiver or the medical facility. It is important for you to follow up on all of your test results.  SEEK IMMEDIATE MEDICAL ATTENTION IF:  You have more than a spotting of blood in your stool.   Your belly is swollen (abdominal distention).   You are nauseated or vomiting.   You have a temperature over 101.   You have abdominal pain or discomfort that is severe or gets worse throughout the day.   Colon polyp information provided  Further recommendations to follow pending review of pathology report  At patient request,  I called Sister Juliann Pulse at [phone]; reviewed results.     Colon Polyps  Polyps are tissue growths inside the body. Polyps can grow in many places, including the large intestine (colon). A polyp may be a round bump or a mushroom-shaped growth. You could have one polyp or several. Most colon polyps are noncancerous (benign). However, some colon polyps can become cancerous over time. Finding and removing the polyps early can help prevent this. What are the causes? The exact cause of colon polyps is not known. What increases the risk? You are more likely to develop this condition if you:  Have a family history of colon cancer or colon polyps.  Are older than 47 or older than 45 if you are African American.  Have inflammatory bowel  disease, such as ulcerative colitis or Crohn's disease.  Have certain hereditary conditions, such as: ? Familial adenomatous polyposis. ? Lynch syndrome. ? Turcot syndrome. ? Peutz-Jeghers syndrome.  Are overweight.  Smoke cigarettes.  Do not get enough exercise.  Drink too much alcohol.  Eat a diet that is high in fat and red meat and low in fiber.  Had childhood cancer that was treated with abdominal radiation. What are the signs or symptoms? Most polyps do not cause symptoms. If you have symptoms, they may  include:  Blood coming from your rectum when having a bowel movement.  Blood in your stool. The stool may look dark red or black.  Abdominal pain.  A change in bowel habits, such as constipation or diarrhea. How is this diagnosed? This condition is diagnosed with a colonoscopy. This is a procedure in which a lighted, flexible scope is inserted into the anus and then passed into the colon to examine the area. Polyps are sometimes found when a colonoscopy is done as part of routine cancer screening tests. How is this treated? Treatment for this condition involves removing any polyps that are found. Most polyps can be removed during a colonoscopy. Those polyps will then be tested for cancer. Additional treatment may be needed depending on the results of testing. Follow these instructions at home: Lifestyle  Maintain a healthy weight, or lose weight if recommended by your health care provider.  Exercise every day or as told by your health care provider.  Do not use any products that contain nicotine or tobacco, such as cigarettes and e-cigarettes. If you need help quitting, ask your health care provider.  If you drink alcohol, limit how much you have: ? 0-1 drink a day for women. ? 0-2 drinks a day for men.  Be aware of how much alcohol is in your drink. In the [place] one drink equals one 12 oz bottle of beer (355 mL), one 5 oz glass of wine (148 mL), or one 1 oz shot of hard liquor (44 mL). Eating and drinking   Eat foods that are high in fiber, such as fruits, vegetables, and whole grains.  Eat foods that are high in calcium and vitamin D, such as milk, cheese, yogurt, eggs, liver, fish, and broccoli.  Limit foods that are high in fat, such as fried foods and desserts.  Limit the amount of red meat and processed meat you eat, such as hot dogs, sausage, bacon, and lunch meats. General instructions  Keep all follow-up visits as told by your health care provider. This is  important. ? This includes having regularly scheduled colonoscopies. ? Talk to your health care provider about when you need a colonoscopy. Contact a health care provider if:  You have new or worsening bleeding during a bowel movement.  You have new or increased blood in your stool.  You have a change in bowel habits.  You lose weight for no known reason. Summary  Polyps are tissue growths inside the body. Polyps can grow in many places, including the colon.  Most colon polyps are noncancerous (benign), but some can become cancerous over time.  This condition is diagnosed with a colonoscopy.  Treatment for this condition involves removing any polyps that are found. Most polyps can be removed during a colonoscopy. This information is not intended to replace advice given to you by your health care provider. Make sure you discuss any questions you have with your health care provider. Document Released: 01/07/2004 Document Revised: 07/28/2017  Document Reviewed: 07/28/2017 Elsevier Patient Education  [employer].

## 2019-03-07 NOTE — Op Note (Signed)
[facility] Patient Name: Sylvia Sparks Procedure Date: 03/07/2019 11:45 AM MRN: [id number] Date of Birth: 01/25/1946 Attending MD: Norvel Richards , MD CSN: [id number] Age: 73 Admit Type: Outpatient Procedure:                Colonoscopy Indications:              High risk colon cancer surveillance: Personal                            history of colonic polyps Providers:                Norvel Richards, MD, Lurline Del, RN, Raphael Gibney, Technician Referring MD:              Medicines:                Midazolam 3 mg IV, Meperidine 25 mg IV, Ondansetron                            4 mg IV Complications:            No immediate complications. Estimated Blood Loss:     Estimated blood loss was minimal. Procedure:                Pre-Anesthesia Assessment:                           - Prior to the procedure, a History and Physical                            was performed, and patient medications and                            allergies were reviewed. The patient's tolerance of                            previous anesthesia was also reviewed. The risks                            and benefits of the procedure and the sedation                            options and risks were discussed with the patient.                            All questions were answered, and informed consent                            was obtained. Prior Anticoagulants: The patient has                            taken no previous anticoagulant or antiplatelet  agents. ASA Grade Assessment: II - A patient with                            mild systemic disease. After reviewing the risks                            and benefits, the patient was deemed in                            satisfactory condition to undergo the procedure.                           After obtaining informed consent, the colonoscope                            was passed under direct  vision. Throughout the                            procedure, the patient's blood pressure, pulse, and                            oxygen saturations were monitored continuously. The                            [id number]) scope was introduced through                            the anus and advanced to the the cecum, identified                            by appendiceal orifice and ileocecal valve. The                            colonoscopy was performed without difficulty. The                            patient tolerated the procedure well. The quality                            of the bowel preparation was adequate. Scope In: [id number] PM Scope Out: 1:07:50 PM Scope Withdrawal Time: 0 hours 7 minutes 26 seconds  Total Procedure Duration: 0 hours 11 minutes 1 second  Findings:      The perianal and digital rectal examinations were normal.      A few medium-mouthed diverticula were found in the ascending colon.      A 5 mm polyp was found in the hepatic flexure. The polyp was       semi-pedunculated. The polyp was removed with a cold snare. Resection       and retrieval were complete. Estimated blood loss was minimal.      The exam was otherwise without abnormality on direct and retroflexion       views. Impression:               - Diverticulosis in the ascending colon.                           -  One 5 mm polyp at the hepatic flexure, removed                            with a cold snare. Resected and retrieved.                           - The examination was otherwise normal on direct                            and retroflexion views. Moderate Sedation:      Moderate (conscious) sedation was administered by the endoscopy nurse       and supervised by the endoscopist. The following parameters were       monitored: oxygen saturation, heart rate, blood pressure, respiratory       rate, EKG, adequacy of pulmonary ventilation, and response to care.       Total physician intraservice  time was 14 minutes. Recommendation:           - Patient has a contact number available for                            emergencies. The signs and symptoms of potential                            delayed complications were discussed with the                            patient. Return to normal activities tomorrow.                            Written discharge instructions were provided to the                            patient.                           - Resume previous diet.                           - Continue present medications.                           - Repeat colonoscopy date to be determined after                            pending pathology results are reviewed for                            surveillance based on pathology results.                           - Return to GI office (date not yet determined). Procedure Code(s):        --- Professional ---                           [id number], Colonoscopy, flexible; with removal  of                            tumor(s), polyp(s), or other lesion(s) by snare                            technique                           G0500, Moderate sedation services provided by the                            same physician or other qualified health care                            professional performing a gastrointestinal                            endoscopic service that sedation supports,                            requiring the presence of an independent trained                            observer to assist in the monitoring of the                            patient's level of consciousness and physiological                            status; initial 15 minutes of intra-service time;                            patient age 49 years or older (additional time may                            be reported with [phone], as appropriate) Diagnosis Code(s):        --- Professional ---                           Z86.010, Personal history of colonic polyps                            K63.5, Polyp of colon                           K57.30, Diverticulosis of large intestine without                            perforation or abscess without bleeding CPT copyright 2019 [employer]. All rights reserved. The codes documented in this report are preliminary and upon coder review may  be revised to meet current compliance requirements. Cristopher Estimable. Dujuan Stankowski, MD Norvel Richards, MD 03/07/2019 1:16:21 PM This report has been signed electronically. Number of Addenda: 0

## 2019-03-07 NOTE — H&P (Signed)
@LOGO @   Primary Care Physician:  Celene Squibb, MD Primary Gastroenterologist:  Dr. Gala Romney  Pre-Procedure History & Physical: HPI:  Sylvia Sparks is a 73 y.o. female here for surveillance colonoscopy.  First colonoscopy 3 years ago yielded multiple adenomas removed.  She is here for surveillance examination.  Past Medical History:  Diagnosis Date  . Anemia   . Asthma   . Diabetes mellitus without complication ([place])   . Dyspnea    with pneumonia  . Hypercholesteremia   . Hypertension   . OA (osteoarthritis)   . Pneumonia     Past Surgical History:  Procedure Laterality Date  . COLONOSCOPY N/A 06/20/2015   Procedure: COLONOSCOPY;  Surgeon: Daneil Dolin, MD;  [facility]: [facility];  Service: Endoscopy;  Laterality: N/A;  9:00 AM  . NO PAST SURGERIES    . POLYPECTOMY N/A 06/20/2015   Procedure: POLYPECTOMY;  Surgeon: Daneil Dolin, MD;  [facility]: [facility];  Service: Endoscopy;  Laterality: N/A;  Hepatic flexure polyps x 2/ Polyp opposite ileocecal valve  . TOTAL KNEE ARTHROPLASTY Right 05/09/2018   Procedure: TOTAL KNEE ARTHROPLASTY;  Surgeon: Paralee Cancel, MD;  Location: [facility];  Service: Orthopedics;  Laterality: Right;  70 mins    Prior to Admission medications   Medication Sig Start Date End Date Taking? Authorizing Provider  amLODipine (NORVASC) 10 MG tablet Take 10 mg by mouth daily.   Yes [provider]  aspirin EC 81 MG tablet Take 81 mg by mouth daily.   Yes [provider]  carbonyl iron (FEOSOL) 45 MG TABS tablet Take 45 mg by mouth daily.   Yes [provider]  glipiZIDE (GLUCOTROL) 5 MG tablet Take 2.5 mg by mouth daily. 10/11/18  Yes [provider]  ibuprofen (ADVIL) 600 MG tablet Take 600 mg by mouth 2 (two) times daily.    Yes [provider]  Javier Docker Oil 1000 MG CAPS Take 1,000 mg by mouth daily.   Yes [provider]  Liniments (SALONPAS PAIN RELIEF PATCH EX) Apply 1 application topically daily as  needed (left knee).    Yes [provider]  lovastatin (MEVACOR) 20 MG tablet Take 10 mg by mouth at bedtime.    Yes [provider]  Multiple Vitamins-Minerals (MULTIVITAMINS THER. W/MINERALS) TABS tablet Take 2 tablets by mouth daily.    Yes [provider]  potassium chloride (K-DUR) 10 MEQ tablet Take 10 mEq by mouth 2 (two) times daily.   Yes [provider]  valsartan (DIOVAN) 160 MG tablet Take 160 mg by mouth daily.   Yes [provider]  vitamin C (ASCORBIC ACID) 250 MG tablet Take 500 mg by mouth daily.   Yes [provider]  albuterol (PROVENTIL HFA;VENTOLIN HFA) 108 (90 Base) MCG/ACT inhaler Inhale 2 puffs into the lungs as needed for wheezing or shortness of breath.     [provider]  Chlorpheniramine-APAP (CORICIDIN) 2-325 MG TABS Take 1 tablet by mouth daily as needed (Cold).     [provider]    Allergies as of 01/18/2019  . (No Known Allergies)    History reviewed. No pertinent family history.  Social History   Socioeconomic History  . Marital status: Widowed    Spouse name: Not on file  . Number of children: Not on file  . Years of education: Not on file  . Highest education level: Not on file  Occupational History  . Not on file  Social Needs  .  Financial resource strain: Not on file  . Food insecurity    Worry: Not on file    Inability: Not on file  . Transportation needs    Medical: Not on file    Non-medical: Not on file  Tobacco Use  . Smoking status: Never Smoker  . Smokeless tobacco: Never Used  Substance and Sexual Activity  . Alcohol use: Yes    Comment: Occasional glass of wine  . Drug use: No  . Sexual activity: Not on file  Lifestyle  . Physical activity    Days per week: Not on file    Minutes per session: Not on file  . Stress: Not on file  Relationships  . Social Herbalist on phone: Not on file    Gets together: Not on file    Attends religious  service: Not on file    Active member of club or organization: Not on file    Attends meetings of clubs or organizations: Not on file    Relationship status: Not on file  . Intimate partner violence    Fear of current or ex partner: Not on file    Emotionally abused: Not on file    Physically abused: Not on file    Forced sexual activity: Not on file  Other Topics Concern  . Not on file  Social History Narrative  . Not on file    Review of Systems: See HPI, otherwise negative ROS  Physical Exam: BP 127/68   Pulse 78   Temp 98.4 F (36.9 C) (Oral)   Resp 15   Ht 5' 6.5" (1.689 m)   Wt 73.9 kg   SpO2 100%   BMI 25.91 kg/m  General:   Alert,  Well-developed, well-nourished, pleasant and cooperative in NAD Neck:  Supple; no masses or thyromegaly. No significant cervical adenopathy. Lungs:  Clear throughout to auscultation.   No wheezes, crackles, or rhonchi. No acute distress. Heart:  Regular rate and rhythm; no murmurs, clicks, rubs,  or gallops. Abdomen: Non-distended, normal bowel sounds.  Soft and nontender without appreciable mass or hepatosplenomegaly.  Pulses:  Normal pulses noted. Extremities:  Without clubbing or edema.  Impression/Plan: 73 year old lady with a history of multiple colonic adenomas removed 3 years ago.  Here for surveillance colonoscopy.  The risks, benefits, limitations, alternatives and imponderables have been reviewed with the patient. Questions have been answered. All parties are agreeable.     Notice: This dictation was prepared with Dragon dictation along with smaller phrase technology. Any transcriptional errors that result from this process are unintentional and may not be corrected upon review.

## 2019-03-08 LAB — GLUCOSE, CAPILLARY: Glucose-Capillary: 93 mg/dL (ref 70–99)

## 2019-03-08 LAB — SURGICAL PATHOLOGY

## 2019-03-09 ENCOUNTER — Encounter: Payer: Self-pay | Admitting: Internal Medicine

## 2019-03-12 ENCOUNTER — Encounter (HOSPITAL_COMMUNITY): Payer: Self-pay | Admitting: Internal Medicine

## 2019-04-02 DIAGNOSIS — H43812 Vitreous degeneration, left eye: Secondary | ICD-10-CM | POA: Diagnosis not present

## 2019-04-02 DIAGNOSIS — H35033 Hypertensive retinopathy, bilateral: Secondary | ICD-10-CM | POA: Diagnosis not present

## 2019-04-02 DIAGNOSIS — H02403 Unspecified ptosis of bilateral eyelids: Secondary | ICD-10-CM | POA: Diagnosis not present

## 2019-06-10 ENCOUNTER — Ambulatory Visit: Payer: Medicare Other | Attending: Internal Medicine

## 2019-06-10 ENCOUNTER — Other Ambulatory Visit: Payer: Self-pay

## 2019-06-10 DIAGNOSIS — Z23 Encounter for immunization: Secondary | ICD-10-CM | POA: Insufficient documentation

## 2019-06-10 NOTE — Progress Notes (Signed)
   Covid-19 Vaccination Clinic  Name:  Sylvia Sparks    MRN: [id number] DOB: Jun 12, 1945  06/10/2019  [name] was observed post Covid-19 immunization for 15 minutes without incidence. She was provided with Vaccine Information Sheet and instruction to access the V-Safe system.   [name] was instructed to call 911 with any severe reactions post vaccine: Marland Kitchen Difficulty breathing  . Swelling of your face and throat  . A fast heartbeat  . A bad rash all over your body  . Dizziness and weakness    Immunizations Administered    Name Date Dose VIS Date Route   Moderna COVID-19 Vaccine 06/10/2019  1:19 PM 0.5 mL 03/27/2019 Intramuscular   Manufacturer: Moderna   Lot: [id number]   [id number]

## 2019-06-18 DIAGNOSIS — Z6827 Body mass index (BMI) 27.0-27.9, adult: Secondary | ICD-10-CM | POA: Diagnosis not present

## 2019-06-18 DIAGNOSIS — E782 Mixed hyperlipidemia: Secondary | ICD-10-CM | POA: Diagnosis not present

## 2019-06-18 DIAGNOSIS — Z6826 Body mass index (BMI) 26.0-26.9, adult: Secondary | ICD-10-CM | POA: Diagnosis not present

## 2019-06-18 DIAGNOSIS — J060 Acute laryngopharyngitis: Secondary | ICD-10-CM | POA: Diagnosis not present

## 2019-06-18 DIAGNOSIS — I129 Hypertensive chronic kidney disease with stage 1 through stage 4 chronic kidney disease, or unspecified chronic kidney disease: Secondary | ICD-10-CM | POA: Diagnosis not present

## 2019-06-18 DIAGNOSIS — E663 Overweight: Secondary | ICD-10-CM | POA: Diagnosis not present

## 2019-06-18 DIAGNOSIS — N183 Chronic kidney disease, stage 3 unspecified: Secondary | ICD-10-CM | POA: Diagnosis not present

## 2019-06-18 DIAGNOSIS — H6502 Acute serous otitis media, left ear: Secondary | ICD-10-CM | POA: Diagnosis not present

## 2019-06-18 DIAGNOSIS — E1122 Type 2 diabetes mellitus with diabetic chronic kidney disease: Secondary | ICD-10-CM | POA: Diagnosis not present

## 2019-06-18 DIAGNOSIS — N182 Chronic kidney disease, stage 2 (mild): Secondary | ICD-10-CM | POA: Diagnosis not present

## 2019-06-18 DIAGNOSIS — I10 Essential (primary) hypertension: Secondary | ICD-10-CM | POA: Diagnosis not present

## 2019-06-18 DIAGNOSIS — N390 Urinary tract infection, site not specified: Secondary | ICD-10-CM | POA: Diagnosis not present

## 2019-06-19 DIAGNOSIS — N390 Urinary tract infection, site not specified: Secondary | ICD-10-CM | POA: Diagnosis not present

## 2019-06-19 DIAGNOSIS — Z0001 Encounter for general adult medical examination with abnormal findings: Secondary | ICD-10-CM | POA: Diagnosis not present

## 2019-06-19 DIAGNOSIS — M1711 Unilateral primary osteoarthritis, right knee: Secondary | ICD-10-CM | POA: Diagnosis not present

## 2019-06-19 DIAGNOSIS — N1831 Chronic kidney disease, stage 3a: Secondary | ICD-10-CM | POA: Diagnosis not present

## 2019-06-19 DIAGNOSIS — M1712 Unilateral primary osteoarthritis, left knee: Secondary | ICD-10-CM | POA: Diagnosis not present

## 2019-06-19 DIAGNOSIS — M25531 Pain in right wrist: Secondary | ICD-10-CM | POA: Diagnosis not present

## 2019-06-19 DIAGNOSIS — Z6827 Body mass index (BMI) 27.0-27.9, adult: Secondary | ICD-10-CM | POA: Diagnosis not present

## 2019-06-19 DIAGNOSIS — E663 Overweight: Secondary | ICD-10-CM | POA: Diagnosis not present

## 2019-06-19 DIAGNOSIS — E1122 Type 2 diabetes mellitus with diabetic chronic kidney disease: Secondary | ICD-10-CM | POA: Diagnosis not present

## 2019-06-19 DIAGNOSIS — I129 Hypertensive chronic kidney disease with stage 1 through stage 4 chronic kidney disease, or unspecified chronic kidney disease: Secondary | ICD-10-CM | POA: Diagnosis not present

## 2019-06-19 DIAGNOSIS — E782 Mixed hyperlipidemia: Secondary | ICD-10-CM | POA: Diagnosis not present

## 2019-06-29 DIAGNOSIS — M19111 Post-traumatic osteoarthritis, right shoulder: Secondary | ICD-10-CM | POA: Diagnosis not present

## 2019-06-29 DIAGNOSIS — M25531 Pain in right wrist: Secondary | ICD-10-CM | POA: Diagnosis not present

## 2019-06-29 DIAGNOSIS — M654 Radial styloid tenosynovitis [de Quervain]: Secondary | ICD-10-CM | POA: Diagnosis not present

## 2019-06-29 DIAGNOSIS — M25511 Pain in right shoulder: Secondary | ICD-10-CM | POA: Diagnosis not present

## 2019-07-08 ENCOUNTER — Ambulatory Visit: Payer: Medicare Other | Attending: Internal Medicine

## 2019-07-08 DIAGNOSIS — Z23 Encounter for immunization: Secondary | ICD-10-CM

## 2019-07-08 NOTE — Progress Notes (Signed)
   Covid-19 Vaccination Clinic  Name:  Sylvia Sparks    MRN: [id number] DOB: 1945-09-27  07/08/2019  [name] was observed post Covid-19 immunization for 15 minutes without incident. She was provided with Vaccine Information Sheet and instruction to access the V-Safe system.   [name] was instructed to call 911 with any severe reactions post vaccine: Marland Kitchen Difficulty breathing  . Swelling of face and throat  . A fast heartbeat  . A bad rash all over body  . Dizziness and weakness   Immunizations Administered    Name Date Dose VIS Date Route   Moderna COVID-19 Vaccine 07/08/2019 12:56 PM 0.5 mL 03/27/2019 Intramuscular   Manufacturer: Moderna   Lot: [id number]   [id number]

## 2019-08-13 ENCOUNTER — Other Ambulatory Visit (HOSPITAL_COMMUNITY): Payer: Self-pay | Admitting: Adult Health Nurse Practitioner

## 2019-08-13 DIAGNOSIS — Z1231 Encounter for screening mammogram for malignant neoplasm of breast: Secondary | ICD-10-CM

## 2019-09-03 ENCOUNTER — Ambulatory Visit (HOSPITAL_COMMUNITY)
Admission: RE | Admit: 2019-09-03 | Discharge: 2019-09-03 | Disposition: A | Discharge status: Home / Self Care | Payer: Medicare Other | Source: Ambulatory Visit | Attending: Adult Health Nurse Practitioner | Admitting: Adult Health Nurse Practitioner

## 2019-09-03 ENCOUNTER — Other Ambulatory Visit: Payer: Self-pay

## 2019-09-03 DIAGNOSIS — Z1231 Encounter for screening mammogram for malignant neoplasm of breast: Secondary | ICD-10-CM | POA: Diagnosis not present

## 2019-12-14 DIAGNOSIS — N182 Chronic kidney disease, stage 2 (mild): Secondary | ICD-10-CM | POA: Diagnosis not present

## 2019-12-14 DIAGNOSIS — N390 Urinary tract infection, site not specified: Secondary | ICD-10-CM | POA: Diagnosis not present

## 2019-12-14 DIAGNOSIS — M25531 Pain in right wrist: Secondary | ICD-10-CM | POA: Diagnosis not present

## 2019-12-14 DIAGNOSIS — H6502 Acute serous otitis media, left ear: Secondary | ICD-10-CM | POA: Diagnosis not present

## 2019-12-14 DIAGNOSIS — Z6827 Body mass index (BMI) 27.0-27.9, adult: Secondary | ICD-10-CM | POA: Diagnosis not present

## 2019-12-14 DIAGNOSIS — E1122 Type 2 diabetes mellitus with diabetic chronic kidney disease: Secondary | ICD-10-CM | POA: Diagnosis not present

## 2019-12-14 DIAGNOSIS — J060 Acute laryngopharyngitis: Secondary | ICD-10-CM | POA: Diagnosis not present

## 2019-12-14 DIAGNOSIS — I10 Essential (primary) hypertension: Secondary | ICD-10-CM | POA: Diagnosis not present

## 2019-12-14 DIAGNOSIS — E663 Overweight: Secondary | ICD-10-CM | POA: Diagnosis not present

## 2019-12-14 DIAGNOSIS — E782 Mixed hyperlipidemia: Secondary | ICD-10-CM | POA: Diagnosis not present

## 2019-12-14 DIAGNOSIS — I129 Hypertensive chronic kidney disease with stage 1 through stage 4 chronic kidney disease, or unspecified chronic kidney disease: Secondary | ICD-10-CM | POA: Diagnosis not present

## 2019-12-14 DIAGNOSIS — M25521 Pain in right elbow: Secondary | ICD-10-CM | POA: Diagnosis not present

## 2019-12-19 DIAGNOSIS — E1122 Type 2 diabetes mellitus with diabetic chronic kidney disease: Secondary | ICD-10-CM | POA: Diagnosis not present

## 2019-12-19 DIAGNOSIS — M1712 Unilateral primary osteoarthritis, left knee: Secondary | ICD-10-CM | POA: Diagnosis not present

## 2019-12-19 DIAGNOSIS — I129 Hypertensive chronic kidney disease with stage 1 through stage 4 chronic kidney disease, or unspecified chronic kidney disease: Secondary | ICD-10-CM | POA: Diagnosis not present

## 2019-12-19 DIAGNOSIS — Z6826 Body mass index (BMI) 26.0-26.9, adult: Secondary | ICD-10-CM | POA: Diagnosis not present

## 2019-12-19 DIAGNOSIS — N1831 Chronic kidney disease, stage 3a: Secondary | ICD-10-CM | POA: Diagnosis not present

## 2019-12-19 DIAGNOSIS — E782 Mixed hyperlipidemia: Secondary | ICD-10-CM | POA: Diagnosis not present

## 2019-12-19 DIAGNOSIS — E663 Overweight: Secondary | ICD-10-CM | POA: Diagnosis not present

## 2020-01-30 DIAGNOSIS — Z23 Encounter for immunization: Secondary | ICD-10-CM | POA: Diagnosis not present

## 2020-03-21 DIAGNOSIS — Z23 Encounter for immunization: Secondary | ICD-10-CM | POA: Diagnosis not present

## 2020-04-03 DIAGNOSIS — H25013 Cortical age-related cataract, bilateral: Secondary | ICD-10-CM | POA: Diagnosis not present

## 2020-04-03 DIAGNOSIS — H35363 Drusen (degenerative) of macula, bilateral: Secondary | ICD-10-CM | POA: Diagnosis not present

## 2020-04-03 DIAGNOSIS — E119 Type 2 diabetes mellitus without complications: Secondary | ICD-10-CM | POA: Diagnosis not present

## 2020-04-03 DIAGNOSIS — H524 Presbyopia: Secondary | ICD-10-CM | POA: Diagnosis not present

## 2020-04-03 DIAGNOSIS — H2513 Age-related nuclear cataract, bilateral: Secondary | ICD-10-CM | POA: Diagnosis not present

## 2020-06-13 DIAGNOSIS — M25531 Pain in right wrist: Secondary | ICD-10-CM | POA: Diagnosis not present

## 2020-06-13 DIAGNOSIS — I10 Essential (primary) hypertension: Secondary | ICD-10-CM | POA: Diagnosis not present

## 2020-06-13 DIAGNOSIS — Z6827 Body mass index (BMI) 27.0-27.9, adult: Secondary | ICD-10-CM | POA: Diagnosis not present

## 2020-06-13 DIAGNOSIS — E663 Overweight: Secondary | ICD-10-CM | POA: Diagnosis not present

## 2020-06-13 DIAGNOSIS — H6502 Acute serous otitis media, left ear: Secondary | ICD-10-CM | POA: Diagnosis not present

## 2020-06-13 DIAGNOSIS — I129 Hypertensive chronic kidney disease with stage 1 through stage 4 chronic kidney disease, or unspecified chronic kidney disease: Secondary | ICD-10-CM | POA: Diagnosis not present

## 2020-06-13 DIAGNOSIS — M25521 Pain in right elbow: Secondary | ICD-10-CM | POA: Diagnosis not present

## 2020-06-13 DIAGNOSIS — N182 Chronic kidney disease, stage 2 (mild): Secondary | ICD-10-CM | POA: Diagnosis not present

## 2020-06-13 DIAGNOSIS — E1122 Type 2 diabetes mellitus with diabetic chronic kidney disease: Secondary | ICD-10-CM | POA: Diagnosis not present

## 2020-06-13 DIAGNOSIS — J060 Acute laryngopharyngitis: Secondary | ICD-10-CM | POA: Diagnosis not present

## 2020-06-13 DIAGNOSIS — N390 Urinary tract infection, site not specified: Secondary | ICD-10-CM | POA: Diagnosis not present

## 2020-06-13 DIAGNOSIS — E782 Mixed hyperlipidemia: Secondary | ICD-10-CM | POA: Diagnosis not present

## 2020-06-18 DIAGNOSIS — M1712 Unilateral primary osteoarthritis, left knee: Secondary | ICD-10-CM | POA: Diagnosis not present

## 2020-06-18 DIAGNOSIS — I129 Hypertensive chronic kidney disease with stage 1 through stage 4 chronic kidney disease, or unspecified chronic kidney disease: Secondary | ICD-10-CM | POA: Diagnosis not present

## 2020-06-18 DIAGNOSIS — N1831 Chronic kidney disease, stage 3a: Secondary | ICD-10-CM | POA: Diagnosis not present

## 2020-06-18 DIAGNOSIS — Z6826 Body mass index (BMI) 26.0-26.9, adult: Secondary | ICD-10-CM | POA: Diagnosis not present

## 2020-06-18 DIAGNOSIS — E782 Mixed hyperlipidemia: Secondary | ICD-10-CM | POA: Diagnosis not present

## 2020-06-18 DIAGNOSIS — E1122 Type 2 diabetes mellitus with diabetic chronic kidney disease: Secondary | ICD-10-CM | POA: Diagnosis not present

## 2020-06-18 DIAGNOSIS — E663 Overweight: Secondary | ICD-10-CM | POA: Diagnosis not present

## 2020-07-16 DIAGNOSIS — N3000 Acute cystitis without hematuria: Secondary | ICD-10-CM | POA: Diagnosis not present

## 2020-08-14 ENCOUNTER — Other Ambulatory Visit (HOSPITAL_COMMUNITY): Payer: Self-pay | Admitting: Internal Medicine

## 2020-08-14 DIAGNOSIS — Z1231 Encounter for screening mammogram for malignant neoplasm of breast: Secondary | ICD-10-CM

## 2020-09-04 ENCOUNTER — Ambulatory Visit (HOSPITAL_COMMUNITY)
Admission: RE | Admit: 2020-09-04 | Discharge: 2020-09-04 | Disposition: A | Discharge status: Home / Self Care | Payer: Medicare Other | Source: Ambulatory Visit | Attending: Internal Medicine | Admitting: Internal Medicine

## 2020-09-04 ENCOUNTER — Other Ambulatory Visit: Payer: Self-pay

## 2020-09-04 DIAGNOSIS — Z1231 Encounter for screening mammogram for malignant neoplasm of breast: Secondary | ICD-10-CM | POA: Diagnosis not present

## 2020-10-06 DIAGNOSIS — M25562 Pain in left knee: Secondary | ICD-10-CM | POA: Diagnosis not present

## 2020-10-06 DIAGNOSIS — M1712 Unilateral primary osteoarthritis, left knee: Secondary | ICD-10-CM | POA: Diagnosis not present

## 2020-10-14 DIAGNOSIS — M25562 Pain in left knee: Secondary | ICD-10-CM | POA: Diagnosis not present

## 2020-10-14 DIAGNOSIS — Z23 Encounter for immunization: Secondary | ICD-10-CM | POA: Diagnosis not present

## 2020-10-14 DIAGNOSIS — Z01818 Encounter for other preprocedural examination: Secondary | ICD-10-CM | POA: Diagnosis not present

## 2020-10-20 NOTE — H&P (Signed)
TOTAL KNEE ADMISSION H&P  Patient is being admitted for left total knee arthroplasty.  Subjective:  Chief Complaint:left knee pain.  HPI: Sylvia Sparks, 75 y.o. female, has a history of pain and functional disability in the left knee due to arthritis and has failed non-surgical conservative treatments for greater than 12 weeks to includeNSAID's and/or analgesics and activity modification.  Onset of symptoms was gradual, starting 2 years ago with gradually worsening course since that time. The patient noted no past surgery on the left knee(s).  Patient currently rates pain in the left knee(s) at 7 out of 10 with activity. Patient has worsening of pain with activity and weight bearing and pain that interferes with activities of daily living.  Patient has evidence of joint space narrowing by imaging studies. There is no active infection.  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   History of colonic polyps    Past Medical History:  Diagnosis Date   Anemia    Asthma    Diabetes mellitus without complication ([place])    Dyspnea    with pneumonia   Hypercholesteremia    Hypertension    OA (osteoarthritis)    Pneumonia     Past Surgical History:  Procedure Laterality Date   COLONOSCOPY N/A 06/20/2015   Procedure: COLONOSCOPY;  Surgeon: Daneil Dolin, MD;  [facility];  Service: Endoscopy;  Laterality: N/A;  9:00 AM   COLONOSCOPY N/A 03/07/2019   Procedure: COLONOSCOPY;  Surgeon: Daneil Dolin, MD;  [facility];  Service: Endoscopy;  Laterality: N/A;  1:00   NO PAST SURGERIES     POLYPECTOMY N/A 06/20/2015   Procedure: POLYPECTOMY;  Surgeon: Daneil Dolin, MD;  [facility];  Service: Endoscopy;  Laterality: N/A;  Hepatic flexure polyps x 2/ Polyp opposite ileocecal valve   POLYPECTOMY  03/07/2019   Procedure: POLYPECTOMY;  Surgeon: Daneil Dolin, MD;  [facility];  Service: Endoscopy;;  transverse   TOTAL KNEE ARTHROPLASTY Right 05/09/2018   Procedure: TOTAL KNEE ARTHROPLASTY;  Surgeon: Paralee Cancel, MD;  Location: [facility];  Service: Orthopedics;  Laterality: Right;  70 mins    No current facility-administered medications for this encounter.   Current Outpatient Medications  Medication Sig Dispense Refill Last Dose   acetaminophen (TYLENOL) 650 MG suppository Place 650 mg rectally every 4 (four) hours as needed for moderate pain.      amLODipine (NORVASC) 10 MG tablet Take 10 mg by mouth daily.      aspirin EC 81 MG tablet Take 81 mg by mouth daily.      Cholecalciferol (VITAMIN D) 50 MCG (2000 UT) tablet Take 2,000 Units by mouth daily.      DM-APAP-CPM (CORICIDIN HBP PO) Take 2 capsules by mouth every 6 (six) hours as needed (cold.cough).      Ferrous Sulfate 142 (45 Fe) MG TBCR Take 142 mg by mouth daily.      glipiZIDE (GLUCOTROL) 5 MG tablet Take 2.5 mg by mouth daily.      Krill Oil 1000 MG CAPS Take 1,000 mg by mouth daily.      Liniments (SALONPAS PAIN RELIEF PATCH EX) Apply 1 application topically daily as needed (left knee).       lovastatin (MEVACOR) 10 MG tablet Take 10 mg by mouth at bedtime.       Multiple Vitamins-Minerals (MULTIVITAMINS THER. W/MINERALS) TABS tablet Take  2 tablets by mouth daily.       potassium chloride (K-DUR) 10 MEQ tablet Take 10 mEq by mouth 2 (two) times daily.      valsartan (DIOVAN) 160 MG tablet Take 160 mg by mouth daily.      vitamin C (ASCORBIC ACID) 500 MG tablet Take 1,000 mg by mouth daily.      No Known Allergies  Social History   Tobacco Use   Smoking status: Never   Smokeless tobacco: Never  Substance Use Topics   Alcohol use: Yes    Comment: Occasional glass of wine    No family history on file.   Review of Systems  Constitutional:  Negative for chills and fever.  Respiratory:  Negative for cough and shortness of breath.   Cardiovascular:  Negative for chest pain.   Gastrointestinal:  Negative for nausea and vomiting.  Musculoskeletal:  Positive for arthralgias.   Objective:  Physical Exam Well nourished and well developed. General: Alert and oriented x3, cooperative and pleasant, no acute distress. Head: normocephalic, atraumatic, neck supple. Eyes: EOMI.  Musculoskeletal: Left knee exam: Varus left knee. No effusion. Tenderness medially. Slight flexion contracture but flexion to 120 with tightness  Calves soft and nontender. Motor function intact in LE. Strength 5/5 LE bilaterally. Neuro: Distal pulses 2+. Sensation to light touch intact in LE.  Vital signs in last 24 hours: BP: ()/()  Arterial Line BP: ()/()   Labs:   Estimated body mass index is 25.91 kg/m as calculated from the following:   Height as of 03/07/19: 5' 6.5" (1.689 m).   Weight as of 03/07/19: 73.9 kg.   Imaging Review Plain radiographs demonstrate severe degenerative joint disease of the left knee(s). The overall alignment isneutral. The bone quality appears to be adequate for age and reported activity level.      Assessment/Plan:  End stage arthritis, left knee   The patient history, physical examination, clinical judgment of the provider and imaging studies are consistent with end stage degenerative joint disease of the left knee(s) and total knee arthroplasty is deemed medically necessary. The treatment options including medical management, injection therapy arthroscopy and arthroplasty were discussed at length. The risks and benefits of total knee arthroplasty were presented and reviewed. The risks due to aseptic loosening, infection, stiffness, patella tracking problems, thromboembolic complications and other imponderables were discussed. The patient acknowledged the explanation, agreed to proceed with the plan and consent was signed. Patient is being admitted for inpatient treatment for surgery, pain control, PT, OT, prophylactic antibiotics, VTE prophylaxis,  progressive ambulation and ADL's and discharge planning. The patient is planning to be discharged  home.  Therapy Plans: outpatient therapy at [facility] in [place] Disposition: Home with sister Planned DVT Prophylaxis: aspirin 81mg  BID DME needed: none PCP: Dr. Allyn Kenner, verbal clearance received TXA: IV Allergies: NKDA Anesthesia Concerns: none BMI: 25.9 Last HgbA1c: 5.6%  Other: - Hydrocodone, Robaxin, Celebrex - Staying overnight   Patient's anticipated LOS is less than 2 midnights, meeting these requirements: - Younger than 32 - Lives within 1 hour of care - Has a competent adult at home to recover with post-op recover - NO history of  - Chronic pain requiring opiods  - Diabetes  - Coronary Artery Disease  - Heart failure  - Heart attack  - Stroke  - DVT/VTE  - Cardiac arrhythmia  - Respiratory Failure/COPD  - Renal failure  - Anemia  - Advanced Liver disease  Griffith Citron, PA-C Orthopedic Surgery [facility] [place] [phone]   

## 2020-10-20 NOTE — Patient Instructions (Addendum)
DUE TO COVID-19 ONLY ONE VISITOR IS ALLOWED TO COME WITH YOU AND STAY IN THE WAITING ROOM ONLY DURING PRE OP AND PROCEDURE DAY OF SURGERY. THE 1 VISITOR  MAY VISIT WITH YOU AFTER SURGERY IN YOUR PRIVATE ROOM DURING VISITING HOURS ONLY!  YOU NEED TO HAVE A COVID 19 TEST ON: 10/24/20@ 1:30 PM, THIS TEST MUST BE DONE BEFORE SURGERY,  COVID TESTING SITE [place] [address], IT IS ON THE RIGHT GOING OUT [address] APPROXIMATELY  2 MINUTES PAST [employer] ON THE RIGHT. ONCE YOUR COVID TEST IS COMPLETED,  PLEASE BEGIN THE QUARANTINE INSTRUCTIONS AS OUTLINED IN YOUR HANDOUT.                Sylvia Sparks   Your procedure is scheduled on: 10/28/20   Report to [facility] Main  Entrance   Report to admitting at : 7:00 AM     Call this number if you have problems the morning of surgery [phone]    Remember: NO SOLID FOOD AFTER MIDNIGHT THE NIGHT PRIOR TO SURGERY. NOTHING BY MOUTH EXCEPT CLEAR LIQUIDS UNTIL: 6:30 AM . PLEASE FINISH GATORADE DRINK PER SURGEON ORDER  WHICH NEEDS TO BE COMPLETED AT : 6:30 AM.   CLEAR LIQUID DIET  Foods Allowed                                                                     Foods Excluded  Coffee and tea, regular and decaf                             liquids that you cannot  Plain Jell-O any favor except red or purple                                           see through such as: Fruit ices (not with fruit pulp)                                     milk, soups, orange juice  Iced Popsicles                                    All solid food Carbonated beverages, regular and diet                                    Cranberry, grape and apple juices Sports drinks like Gatorade Lightly seasoned clear broth or consume(fat free) Sugar, honey syrup  Sample Menu Breakfast                                Lunch  Supper Cranberry juice                    Beef broth                             Chicken broth Jell-O                                     Grape juice                           Apple juice Coffee or tea                        Jell-O                                      Popsicle                                                Coffee or tea                        Coffee or tea  _____________________________________________________________________   BRUSH YOUR TEETH MORNING OF SURGERY AND RINSE YOUR MOUTH OUT, NO CHEWING GUM CANDY OR MINTS.    Take these medicines the morning of surgery with A SIP OF WATER: amlodipine How to Manage Your Diabetes Before and After Surgery  Why is it important to control my blood sugar before and after surgery? Improving blood sugar levels before and after surgery helps healing and can limit problems. A way of improving blood sugar control is eating a healthy diet by:  Eating less sugar and carbohydrates  Increasing activity/exercise  Talking with your doctor about reaching your blood sugar goals High blood sugars (greater than 180 mg/dL) can raise your risk of infections and slow your recovery, so you will need to focus on controlling your diabetes during the weeks before surgery. Make sure that the doctor who takes care of your diabetes knows about your planned surgery including the date and location.  How do I manage my blood sugar before surgery? Check your blood sugar at least 4 times a day, starting 2 days before surgery, to make sure that the level is not too high or low. Check your blood sugar the morning of your surgery when you wake up and every 2 hours until you get to the Short Stay unit. If your blood sugar is less than 70 mg/dL, you will need to treat for low blood sugar: Do not take insulin. Treat a low blood sugar (less than 70 mg/dL) with  cup of clear juice (cranberry or apple), 4 glucose tablets, OR glucose gel. Recheck blood sugar in 15 minutes after treatment (to make sure it is greater than 70 mg/dL). If your blood  sugar is not greater than 70 mg/dL on recheck, call [phone] for further instructions. Report your blood sugar to the short stay nurse when you get to Short Stay.  If you are admitted to the hospital after surgery: Your blood sugar will be checked by the staff and  you will probably be given insulin after surgery (instead of oral diabetes medicines) to make sure you have good blood sugar levels. The goal for blood sugar control after surgery is 80-180 mg/dL.   WHAT DO I DO ABOUT MY DIABETES MEDICATION?  Do not take oral diabetes medicines (pills) the morning of surgery.  THE DAY BEFORE SURGERY, take glipizide as usual.       THE MORNING OF SURGERY, DO NOT TAKE ANY DIABETIC MEDICATIONS DAY OF YOUR SURGERY                               You may not have any metal on your body including hair pins and              piercings  Do not wear jewelry, make-up, lotions, powders or perfumes, deodorant             Do not wear nail polish on your fingernails.  Do not shave  48 hours prior to surgery.       Do not bring valuables to the hospital. [place].  Contacts, dentures or bridgework may not be worn into surgery.  Leave suitcase in the car. After surgery it may be brought to your room.     Patients discharged the day of surgery will not be allowed to drive home. IF YOU ARE HAVING SURGERY AND GOING HOME THE SAME DAY, YOU MUST HAVE AN ADULT TO DRIVE YOU HOME AND BE WITH YOU FOR 24 HOURS. YOU MAY GO HOME BY TAXI OR UBER OR ORTHERWISE, BUT AN ADULT MUST ACCOMPANY YOU HOME AND STAY WITH YOU FOR 24 HOURS.  Name and phone number of your driver:  Special Instructions: N/A              Please read over the following fact sheets you were given: _____________________________________________________________________           [facility] - Preparing for Surgery Before surgery, you can play an important role.  Because skin is not sterile, your skin  needs to be as free of germs as possible.  You can reduce the number of germs on your skin by washing with CHG (chlorahexidine gluconate) soap before surgery.  CHG is an antiseptic cleaner which kills germs and bonds with the skin to continue killing germs even after washing. Please DO NOT use if you have an allergy to CHG or antibacterial soaps.  If your skin becomes reddened/irritated stop using the CHG and inform your nurse when you arrive at Short Stay. Do not shave (including legs and underarms) for at least 48 hours prior to the first CHG shower.  You may shave your face/neck. Please follow these instructions carefully:  1.  Shower with CHG Soap the night before surgery and the  morning of Surgery.  2.  If you choose to wash your hair, wash your hair first as usual with your  normal  shampoo.  3.  After you shampoo, rinse your hair and body thoroughly to remove the  shampoo.                           4.  Use CHG as you would any other liquid soap.  You can apply chg directly  to the skin and wash  Gently with a scrungie or clean washcloth.  5.  Apply the CHG Soap to your body ONLY FROM THE NECK DOWN.   Do not use on face/ open                           Wound or open sores. Avoid contact with eyes, ears mouth and genitals (private parts).                       Wash face,  Genitals (private parts) with your normal soap.             6.  Wash thoroughly, paying special attention to the area where your surgery  will be performed.  7.  Thoroughly rinse your body with warm water from the neck down.  8.  DO NOT shower/wash with your normal soap after using and rinsing off  the CHG Soap.                9.  Pat yourself dry with a clean towel.            10.  Wear clean pajamas.            11.  Place clean sheets on your bed the night of your first shower and do not  sleep with pets. Day of Surgery : Do not apply any lotions/deodorants the morning of surgery.  Please wear clean  clothes to the hospital/surgery center.  FAILURE TO FOLLOW THESE INSTRUCTIONS MAY RESULT IN THE CANCELLATION OF YOUR SURGERY PATIENT SIGNATURE_________________________________  NURSE SIGNATURE__________________________________  ________________________________________________________________________   Adam Phenix  An incentive spirometer is a tool that can help keep your lungs clear and active. This tool measures how well you are filling your lungs with each breath. Taking long deep breaths may help reverse or decrease the chance of developing breathing (pulmonary) problems (especially infection) following: A long period of time when you are unable to move or be active. BEFORE THE PROCEDURE  If the spirometer includes an indicator to show your best effort, your nurse or respiratory therapist will set it to a desired goal. If possible, sit up straight or lean slightly forward. Try not to slouch. Hold the incentive spirometer in an upright position. INSTRUCTIONS FOR USE  Sit on the edge of your bed if possible, or sit up as far as you can in bed or on a chair. Hold the incentive spirometer in an upright position. Breathe out normally. Place the mouthpiece in your mouth and seal your lips tightly around it. Breathe in slowly and as deeply as possible, raising the piston or the ball toward the top of the column. Hold your breath for 3-5 seconds or for as long as possible. Allow the piston or ball to fall to the bottom of the column. Remove the mouthpiece from your mouth and breathe out normally. Rest for a few seconds and repeat Steps 1 through 7 at least 10 times every 1-2 hours when you are awake. Take your time and take a few normal breaths between deep breaths. The spirometer may include an indicator to show your best effort. Use the indicator as a goal to work toward during each repetition. After each set of 10 deep breaths, practice coughing to be sure your lungs are clear. If  you have an incision (the cut made at the time of surgery), support your incision when coughing by placing a pillow or rolled up towels  firmly against it. Once you are able to get out of bed, walk around indoors and cough well. You may stop using the incentive spirometer when instructed by your caregiver.  RISKS AND COMPLICATIONS Take your time so you do not get dizzy or light-headed. If you are in pain, you may need to take or ask for pain medication before doing incentive spirometry. It is harder to take a deep breath if you are having pain. AFTER USE Rest and breathe slowly and easily. It can be helpful to keep track of a log of your progress. Your caregiver can provide you with a simple table to help with this. If you are using the spirometer at home, follow these instructions: [place] IF:  You are having difficultly using the spirometer. You have trouble using the spirometer as often as instructed. Your pain medication is not giving enough relief while using the spirometer. You develop fever of 100.5 F (38.1 C) or higher. SEEK IMMEDIATE MEDICAL CARE IF:  You cough up bloody sputum that had not been present before. You develop fever of 102 F (38.9 C) or greater. You develop worsening pain at or near the incision site. MAKE SURE YOU:  Understand these instructions. Will watch your condition. Will get help right away if you are not doing well or get worse. Document Released: 08/23/2006 Document Revised: 07/05/2011 Document Reviewed: 10/24/2006 [facility] Patient Information 2014 [place], [place].   ________________________________________________________________________

## 2020-10-21 ENCOUNTER — Encounter (HOSPITAL_COMMUNITY)
Admission: RE | Admit: 2020-10-21 | Discharge: 2020-10-21 | Disposition: A | Discharge status: Home / Self Care | Payer: Medicare Other | Source: Ambulatory Visit | Attending: Orthopedic Surgery | Admitting: Orthopedic Surgery

## 2020-10-21 ENCOUNTER — Other Ambulatory Visit: Payer: Self-pay

## 2020-10-21 ENCOUNTER — Encounter (HOSPITAL_COMMUNITY): Payer: Self-pay

## 2020-10-21 DIAGNOSIS — Z01812 Encounter for preprocedural laboratory examination: Secondary | ICD-10-CM | POA: Insufficient documentation

## 2020-10-21 LAB — COMPREHENSIVE METABOLIC PANEL
ALT: 358 U/L — ABNORMAL HIGH (ref 0–44)
AST: 335 U/L — ABNORMAL HIGH (ref 15–41)
Albumin: 4.2 g/dL (ref 3.5–5.0)
Alkaline Phosphatase: 199 U/L — ABNORMAL HIGH (ref 38–126)
Anion gap: 5 (ref 5–15)
BUN: 23 mg/dL (ref 8–23)
CO2: 21 mmol/L — ABNORMAL LOW (ref 22–32)
Calcium: 9.2 mg/dL (ref 8.9–10.3)
Chloride: 113 mmol/L — ABNORMAL HIGH (ref 98–111)
Creatinine, Ser: 1.05 mg/dL — ABNORMAL HIGH (ref 0.44–1.00)
GFR, Estimated: 56 mL/min — ABNORMAL LOW (ref >60)
Glucose, Bld: 65 mg/dL — ABNORMAL LOW (ref 70–99)
Potassium: 3.6 mmol/L (ref 3.5–5.1)
Sodium: 139 mmol/L (ref 135–145)
Total Bilirubin: 0.5 mg/dL (ref 0.3–1.2)
Total Protein: 8.2 g/dL — ABNORMAL HIGH (ref 6.5–8.1)

## 2020-10-21 LAB — CBC
HCT: 39.2 % (ref 36.0–46.0)
Hemoglobin: 12.6 g/dL (ref 12.0–15.0)
MCH: 30 pg (ref 26.0–34.0)
MCHC: 32.1 g/dL (ref 30.0–36.0)
MCV: 93.3 fL (ref 80.0–100.0)
Platelets: 299 10*3/uL (ref 150–400)
RBC: 4.2 MIL/uL (ref 3.87–5.11)
RDW: 13.9 % (ref 11.5–15.5)
WBC: 4 10*3/uL (ref 4.0–10.5)
nRBC: 0 % (ref 0.0–0.2)

## 2020-10-21 LAB — SURGICAL PCR SCREEN
MRSA, PCR: NEGATIVE
Staphylococcus aureus: NEGATIVE

## 2020-10-21 LAB — GLUCOSE, CAPILLARY: Glucose-Capillary: 73 mg/dL (ref 70–99)

## 2020-10-21 NOTE — Progress Notes (Signed)
COVID Vaccine Completed: Yes Date COVID Vaccine completed: 10/14/20 COVID vaccine manufacturer:    Moderna      PCP - Dr. Allyn Kenner Cardiologist -   Chest x-ray -  EKG - 10/14/20. requested Stress Test -  ECHO -  Cardiac Cath -  Pacemaker/ICD device last checked:  Sleep Study -  CPAP -   Fasting Blood Sugar -90's  Checks Blood Sugar __1___ times a day  Blood Thinner Instructions: Aspirin Instructions: Last Dose:  Anesthesia review:   Patient denies shortness of breath, fever, cough and chest pain at PAT appointment   Patient verbalized understanding of instructions that were given to them at the PAT appointment. Patient was also instructed that they will need to review over the PAT instructions again at home before surgery.

## 2020-10-22 ENCOUNTER — Encounter (HOSPITAL_COMMUNITY): Payer: Self-pay | Admitting: Physician Assistant

## 2020-10-22 LAB — HEMOGLOBIN A1C
Hgb A1c MFr Bld: 5.8 % — ABNORMAL HIGH (ref 4.8–5.6)
Mean Plasma Glucose: 120 mg/dL

## 2020-10-22 NOTE — Progress Notes (Signed)
Lab. Results: AST: 335.  ALT: 358.

## 2020-10-23 NOTE — Progress Notes (Signed)
Anesthesia Chart Review   Case: [id number] Date/Time: 10/28/20 0947   Procedure: TOTAL KNEE ARTHROPLASTY (Left: Knee)   Anesthesia type: Spinal   Pre-op diagnosis: Left Knee total osteoarthritis   Location: [facility] ROOM 09 / [facility]   Surgeons: Paralee Cancel, MD       DISCUSSION:75 y.o. never smoker with h/o HTN, DM II, left knee OA scheduled for above procedure 10/28/2020 with Dr. Paralee Cancel.   AST 335, ALT 358, Alkaline Phasphatase 199.  No previous labs to compare.  I have called her PCP to discuss, VM left.  Will need evaluation/input before proceeding. Dr. Aurea Graff office made aware.  VS: BP 139/70   Pulse 72   Temp 37.3 C (Oral)   Ht 5\' 6"  (1.676 m)   Wt 71.7 kg   SpO2 100%   BMI 25.50 kg/m   PROVIDERS: Celene Squibb, MD is PCP    LABS:  abnormal AST/ALT called PCP to review, no labs to compare to (all labs ordered are listed, but only abnormal results are displayed)  Labs Reviewed  COMPREHENSIVE METABOLIC PANEL - Abnormal; Notable for the following components:      Result Value   Chloride 113 (*)    CO2 21 (*)    Glucose, Bld 65 (*)    Creatinine, Ser 1.05 (*)    Total Protein 8.2 (*)    AST 335 (*)    ALT 358 (*)    Alkaline Phosphatase 199 (*)    GFR, Estimated 56 (*)    All other components within normal limits  HEMOGLOBIN A1C - Abnormal; Notable for the following components:   Hgb A1c MFr Bld 5.8 (*)    All other components within normal limits  SURGICAL PCR SCREEN  CBC  GLUCOSE, CAPILLARY  TYPE AND SCREEN     IMAGES:   EKG: 10/14/2020 on chart Rate 62 bpm  NSR  CV:  Past Medical History:  Diagnosis Date   Anemia    Asthma    Diabetes mellitus without complication (HCC)    Dyspnea    with pneumonia   Hypercholesteremia    Hypertension    OA (osteoarthritis)    Pneumonia     Past Surgical History:  Procedure Laterality Date   COLONOSCOPY N/A 06/20/2015   Procedure: COLONOSCOPY;  Surgeon: Daneil Dolin, MD;  Location: [facility];   Service: Endoscopy;  Laterality: N/A;  9:00 AM   COLONOSCOPY N/A 03/07/2019   Procedure: COLONOSCOPY;  Surgeon: Daneil Dolin, MD;  Location: [facility];  Service: Endoscopy;  Laterality: N/A;  1:00   NO PAST SURGERIES     POLYPECTOMY N/A 06/20/2015   Procedure: POLYPECTOMY;  Surgeon: Daneil Dolin, MD;  Location: [facility];  Service: Endoscopy;  Laterality: N/A;  Hepatic flexure polyps x 2/ Polyp opposite ileocecal valve   POLYPECTOMY  03/07/2019   Procedure: POLYPECTOMY;  Surgeon: Daneil Dolin, MD;  Location: [facility];  Service: Endoscopy;;  transverse   TOTAL KNEE ARTHROPLASTY Right 05/09/2018   Procedure: TOTAL KNEE ARTHROPLASTY;  Surgeon: Paralee Cancel, MD;  Location: [facility];  Service: Orthopedics;  Laterality: Right;  70 mins    MEDICATIONS:  acetaminophen (TYLENOL) 650 MG suppository   amLODipine (NORVASC) 10 MG tablet   aspirin EC 81 MG tablet   Cholecalciferol (VITAMIN D) 50 MCG (2000 UT) tablet   DM-APAP-CPM (CORICIDIN HBP PO)   Ferrous Sulfate 142 (45 Fe) MG TBCR   glipiZIDE (GLUCOTROL) 5 MG tablet   Javier Docker  Oil 1000 MG CAPS   Liniments (SALONPAS PAIN RELIEF PATCH EX)   lovastatin (MEVACOR) 10 MG tablet   Multiple Vitamins-Minerals (MULTIVITAMINS THER. W/MINERALS) TABS tablet   potassium chloride (K-DUR) 10 MEQ tablet   valsartan (DIOVAN) 160 MG tablet   vitamin C (ASCORBIC ACID) 500 MG tablet   No current facility-administered medications for this encounter.    Konrad Felix, PA-C [facility] Pre-Surgical Testing [phone]

## 2020-10-24 ENCOUNTER — Other Ambulatory Visit (HOSPITAL_COMMUNITY): Payer: Medicare Other

## 2020-10-24 DIAGNOSIS — R7401 Elevation of levels of liver transaminase levels: Secondary | ICD-10-CM | POA: Diagnosis not present

## 2020-10-28 ENCOUNTER — Encounter (HOSPITAL_COMMUNITY): Admission: RE | Payer: Self-pay | Source: Home / Self Care

## 2020-10-28 ENCOUNTER — Ambulatory Visit (HOSPITAL_COMMUNITY): Admission: RE | Admit: 2020-10-28 | Payer: Medicare Other | Source: Home / Self Care | Admitting: Orthopedic Surgery

## 2020-10-28 LAB — TYPE AND SCREEN
ABO/RH(D): O POS
Antibody Screen: NEGATIVE

## 2020-10-28 SURGERY — ARTHROPLASTY, KNEE, TOTAL
Anesthesia: Spinal | Site: Knee | Laterality: Left

## 2020-11-14 NOTE — Progress Notes (Signed)
Sent message, via epic in basket, requesting orders in epic from surgeon.  

## 2020-11-18 NOTE — Patient Instructions (Addendum)
DUE TO COVID-19 ONLY ONE VISITOR IS ALLOWED TO COME WITH YOU AND STAY IN THE WAITING ROOM ONLY DURING PRE OP AND PROCEDURE DAY OF SURGERY. THE 1 VISITOR  MAY VISIT WITH YOU AFTER SURGERY IN YOUR PRIVATE ROOM DURING VISITING HOURS ONLY!  YOU NEED TO HAVE A COVID 19 TEST ON: 11/21/20@  2:30 PM, THIS TEST MUST BE DONE BEFORE SURGERY,  COVID TESTING SITE [place] [address], IT IS ON THE RIGHT GOING OUT [address] APPROXIMATELY  2 MINUTES PAST [employer] ON THE RIGHT. ONCE YOUR COVID TEST IS COMPLETED,  PLEASE BEGIN THE QUARANTINE INSTRUCTIONS AS OUTLINED IN YOUR HANDOUT.               Sylvia Sparks   Your procedure is scheduled on: 11/25/20   Report to [facility] Main  Entrance   Report to admitting at: 10:30 AM     Call this number if you have problems the morning of surgery [phone]    Remember: NO SOLID FOOD AFTER MIDNIGHT THE NIGHT PRIOR TO SURGERY. NOTHING BY MOUTH EXCEPT CLEAR LIQUIDS UNTIL: 9:50 AM . PLEASE FINISH ENSURE DRINK PER SURGEON ORDER  WHICH NEEDS TO BE COMPLETED AT : 9:50 AM  CLEAR LIQUID DIET  Foods Allowed                                                                     Foods Excluded  Coffee and tea, regular and decaf                             liquids that you cannot  Plain Jell-O any favor except red or purple                                           see through such as: Fruit ices (not with fruit pulp)                                     milk, soups, orange juice  Iced Popsicles                                    All solid food Carbonated beverages, regular and diet                                    Cranberry, grape and apple juices Sports drinks like Gatorade Lightly seasoned clear broth or consume(fat free) Sugar, honey syrup  Sample Menu Breakfast                                Lunch  Supper Cranberry juice                    Beef broth                             Chicken broth Jell-O                                     Grape juice                           Apple juice Coffee or tea                        Jell-O                                      Popsicle                                                Coffee or tea                        Coffee or tea  _____________________________________________________________________  BRUSH YOUR TEETH MORNING OF SURGERY AND RINSE YOUR MOUTH OUT, NO CHEWING GUM CANDY OR MINTS.    Take these medicines the morning of surgery with A SIP OF WATER: amlodipine How to Manage Your Diabetes Before and After Surgery  Why is it important to control my blood sugar before and after surgery? Improving blood sugar levels before and after surgery helps healing and can limit problems. A way of improving blood sugar control is eating a healthy diet by:  Eating less sugar and carbohydrates  Increasing activity/exercise  Talking with your doctor about reaching your blood sugar goals High blood sugars (greater than 180 mg/dL) can raise your risk of infections and slow your recovery, so you will need to focus on controlling your diabetes during the weeks before surgery. Make sure that the doctor who takes care of your diabetes knows about your planned surgery including the date and location.  How do I manage my blood sugar before surgery? Check your blood sugar at least 4 times a day, starting 2 days before surgery, to make sure that the level is not too high or low. Check your blood sugar the morning of your surgery when you wake up and every 2 hours until you get to the Short Stay unit. If your blood sugar is less than 70 mg/dL, you will need to treat for low blood sugar: Do not take insulin. Treat a low blood sugar (less than 70 mg/dL) with  cup of clear juice (cranberry or apple), 4 glucose tablets, OR glucose gel. Recheck blood sugar in 15 minutes after treatment (to make sure it is greater than 70 mg/dL). If your blood sugar  is not greater than 70 mg/dL on recheck, call [phone] for further instructions. Report your blood sugar to the short stay nurse when you get to Short Stay.  If you are admitted to the hospital after surgery: Your blood sugar will be checked by the staff and you  will probably be given insulin after surgery (instead of oral diabetes medicines) to make sure you have good blood sugar levels. The goal for blood sugar control after surgery is 80-180 mg/dL.   WHAT DO I DO ABOUT MY DIABETES MEDICATION?  Do not take oral diabetes medicines (pills) the morning of surgery.  THE DAY BEFORE SURGERY, take glipizide as usual in the morning.       THE MORNING OF SURGERY, DO NOT TAKE ANY DIABETIC MEDICATIONS DAY OF YOUR SURGERY                               You may not have any metal on your body including hair pins and              piercings  Do not wear jewelry, make-up, lotions, powders or perfumes, deodorant             Do not wear nail polish on your fingernails.  Do not shave  48 hours prior to surgery.    Do not bring valuables to the hospital. [place].  Contacts, dentures or bridgework may not be worn into surgery.  Leave suitcase in the car. After surgery it may be brought to your room.     Patients discharged the day of surgery will not be allowed to drive home. IF YOU ARE HAVING SURGERY AND GOING HOME THE SAME DAY, YOU MUST HAVE AN ADULT TO DRIVE YOU HOME AND BE WITH YOU FOR 24 HOURS. YOU MAY GO HOME BY TAXI OR UBER OR ORTHERWISE, BUT AN ADULT MUST ACCOMPANY YOU HOME AND STAY WITH YOU FOR 24 HOURS.  Name and phone number of your driver:  Special Instructions: N/A              Please read over the following fact sheets you were given: _____________________________________________________________________           [facility] - Preparing for Surgery Before surgery, you can play an important role.  Because skin is not sterile, your  skin needs to be as free of germs as possible.  You can reduce the number of germs on your skin by washing with CHG (chlorahexidine gluconate) soap before surgery.  CHG is an antiseptic cleaner which kills germs and bonds with the skin to continue killing germs even after washing. Please DO NOT use if you have an allergy to CHG or antibacterial soaps.  If your skin becomes reddened/irritated stop using the CHG and inform your nurse when you arrive at Short Stay. Do not shave (including legs and underarms) for at least 48 hours prior to the first CHG shower.  You may shave your face/neck. Please follow these instructions carefully:  1.  Shower with CHG Soap the night before surgery and the  morning of Surgery.  2.  If you choose to wash your hair, wash your hair first as usual with your  normal  shampoo.  3.  After you shampoo, rinse your hair and body thoroughly to remove the  shampoo.                           4.  Use CHG as you would any other liquid soap.  You can apply chg directly  to the skin and wash  Gently with a scrungie or clean washcloth.  5.  Apply the CHG Soap to your body ONLY FROM THE NECK DOWN.   Do not use on face/ open                           Wound or open sores. Avoid contact with eyes, ears mouth and genitals (private parts).                       Wash face,  Genitals (private parts) with your normal soap.             6.  Wash thoroughly, paying special attention to the area where your surgery  will be performed.  7.  Thoroughly rinse your body with warm water from the neck down.  8.  DO NOT shower/wash with your normal soap after using and rinsing off  the CHG Soap.                9.  Pat yourself dry with a clean towel.            10.  Wear clean pajamas.            11.  Place clean sheets on your bed the night of your first shower and do not  sleep with pets. Day of Surgery : Do not apply any lotions/deodorants the morning of surgery.  Please wear  clean clothes to the hospital/surgery center.  FAILURE TO FOLLOW THESE INSTRUCTIONS MAY RESULT IN THE CANCELLATION OF YOUR SURGERY PATIENT SIGNATURE_________________________________  NURSE SIGNATURE__________________________________  ________________________________________________________________________   Adam Phenix  An incentive spirometer is a tool that can help keep your lungs clear and active. This tool measures how well you are filling your lungs with each breath. Taking long deep breaths may help reverse or decrease the chance of developing breathing (pulmonary) problems (especially infection) following: A long period of time when you are unable to move or be active. BEFORE THE PROCEDURE  If the spirometer includes an indicator to show your best effort, your nurse or respiratory therapist will set it to a desired goal. If possible, sit up straight or lean slightly forward. Try not to slouch. Hold the incentive spirometer in an upright position. INSTRUCTIONS FOR USE  Sit on the edge of your bed if possible, or sit up as far as you can in bed or on a chair. Hold the incentive spirometer in an upright position. Breathe out normally. Place the mouthpiece in your mouth and seal your lips tightly around it. Breathe in slowly and as deeply as possible, raising the piston or the ball toward the top of the column. Hold your breath for 3-5 seconds or for as long as possible. Allow the piston or ball to fall to the bottom of the column. Remove the mouthpiece from your mouth and breathe out normally. Rest for a few seconds and repeat Steps 1 through 7 at least 10 times every 1-2 hours when you are awake. Take your time and take a few normal breaths between deep breaths. The spirometer may include an indicator to show your best effort. Use the indicator as a goal to work toward during each repetition. After each set of 10 deep breaths, practice coughing to be sure your lungs are  clear. If you have an incision (the cut made at the time of surgery), support your incision when coughing by placing a pillow or rolled up towels  firmly against it. Once you are able to get out of bed, walk around indoors and cough well. You may stop using the incentive spirometer when instructed by your caregiver.  RISKS AND COMPLICATIONS Take your time so you do not get dizzy or light-headed. If you are in pain, you may need to take or ask for pain medication before doing incentive spirometry. It is harder to take a deep breath if you are having pain. AFTER USE Rest and breathe slowly and easily. It can be helpful to keep track of a log of your progress. Your caregiver can provide you with a simple table to help with this. If you are using the spirometer at home, follow these instructions: [place] IF:  You are having difficultly using the spirometer. You have trouble using the spirometer as often as instructed. Your pain medication is not giving enough relief while using the spirometer. You develop fever of 100.5 F (38.1 C) or higher. SEEK IMMEDIATE MEDICAL CARE IF:  You cough up bloody sputum that had not been present before. You develop fever of 102 F (38.9 C) or greater. You develop worsening pain at or near the incision site. MAKE SURE YOU:  Understand these instructions. Will watch your condition. Will get help right away if you are not doing well or get worse. Document Released: 08/23/2006 Document Revised: 07/05/2011 Document Reviewed: 10/24/2006 [facility] Patient Information 2014 [place], [place].   ________________________________________________________________________

## 2020-11-19 ENCOUNTER — Encounter (HOSPITAL_COMMUNITY)
Admission: RE | Admit: 2020-11-19 | Discharge: 2020-11-19 | Disposition: A | Discharge status: Home / Self Care | Payer: Medicare Other | Source: Ambulatory Visit | Attending: Orthopedic Surgery | Admitting: Orthopedic Surgery

## 2020-11-19 ENCOUNTER — Encounter (HOSPITAL_COMMUNITY): Payer: Self-pay

## 2020-11-19 ENCOUNTER — Other Ambulatory Visit: Payer: Self-pay

## 2020-11-19 DIAGNOSIS — Z01812 Encounter for preprocedural laboratory examination: Secondary | ICD-10-CM | POA: Diagnosis not present

## 2020-11-19 LAB — COMPREHENSIVE METABOLIC PANEL
ALT: 30 U/L (ref 0–44)
AST: 25 U/L (ref 15–41)
Albumin: 4.1 g/dL (ref 3.5–5.0)
Alkaline Phosphatase: 86 U/L (ref 38–126)
Anion gap: 8 (ref 5–15)
BUN: 26 mg/dL — ABNORMAL HIGH (ref 8–23)
CO2: 21 mmol/L — ABNORMAL LOW (ref 22–32)
Calcium: 9.6 mg/dL (ref 8.9–10.3)
Chloride: 109 mmol/L (ref 98–111)
Creatinine, Ser: 1.21 mg/dL — ABNORMAL HIGH (ref 0.44–1.00)
GFR, Estimated: 47 mL/min — ABNORMAL LOW (ref >60)
Glucose, Bld: 83 mg/dL (ref 70–99)
Potassium: 3.9 mmol/L (ref 3.5–5.1)
Sodium: 138 mmol/L (ref 135–145)
Total Bilirubin: 0.5 mg/dL (ref 0.3–1.2)
Total Protein: 8.2 g/dL — ABNORMAL HIGH (ref 6.5–8.1)

## 2020-11-19 LAB — CBC
HCT: 39.3 % (ref 36.0–46.0)
Hemoglobin: 12.5 g/dL (ref 12.0–15.0)
MCH: 29.8 pg (ref 26.0–34.0)
MCHC: 31.8 g/dL (ref 30.0–36.0)
MCV: 93.8 fL (ref 80.0–100.0)
Platelets: 261 10*3/uL (ref 150–400)
RBC: 4.19 MIL/uL (ref 3.87–5.11)
RDW: 13.2 % (ref 11.5–15.5)
WBC: 4.4 10*3/uL (ref 4.0–10.5)
nRBC: 0 % (ref 0.0–0.2)

## 2020-11-19 LAB — SURGICAL PCR SCREEN
MRSA, PCR: NEGATIVE
Staphylococcus aureus: NEGATIVE

## 2020-11-19 LAB — GLUCOSE, CAPILLARY: Glucose-Capillary: 81 mg/dL (ref 70–99)

## 2020-11-19 NOTE — Progress Notes (Addendum)
COVID Vaccine Completed: Yes Date COVID Vaccine completed: 09/2020. Second boaster COVID vaccine manufacturer:    Moderna    PCP - Dr. Allyn Kenner. Cardiologist -   Chest x-ray -  EKG - 10/14/20: Chart Stress Test -  ECHO -  Cardiac Cath -  Pacemaker/ICD device last checked:  Sleep Study -  CPAP -   Fasting Blood Sugar - 90's Checks Blood Sugar __2___ times a day  Blood Thinner Instructions: By Dr. Alvan Dame Aspirin Instructions:It's been held since 11/17/20 Last Dose:  Anesthesia review:   Patient denies shortness of breath, fever, cough and chest pain at PAT appointment   Patient verbalized understanding of instructions that were given to them at the PAT appointment. Patient was also instructed that they will need to review over the PAT instructions again at home before surgery.

## 2020-11-20 NOTE — H&P (Signed)
TOTAL KNEE ADMISSION H&P  Patient is being admitted for left total knee arthroplasty.  Subjective:  Chief Complaint:left knee pain.  HPI: Sylvia Sparks, 75 y.o. female, has a history of pain and functional disability in the left knee due to arthritis and has failed non-surgical conservative treatments for greater than 12 weeks to includeNSAID's and/or analgesics and activity modification.  Onset of symptoms was gradual, starting 2 years ago with gradually worsening course since that time. The patient noted no past surgery on the left knee(s).  Patient currently rates pain in the left knee(s) at 7 out of 10 with activity. Patient has worsening of pain with activity and weight bearing and pain that interferes with activities of daily living.  Patient has evidence of joint space narrowing by imaging studies. There is no active infection.  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   History of colonic polyps    Past Medical History:  Diagnosis Date   Anemia    Asthma    Diabetes mellitus without complication ([place])    Dyspnea    with pneumonia   Hypercholesteremia    Hypertension    OA (osteoarthritis)    Pneumonia     Past Surgical History:  Procedure Laterality Date   COLONOSCOPY N/A 06/20/2015   Procedure: COLONOSCOPY;  Surgeon: Daneil Dolin, MD;  [facility];  Service: Endoscopy;  Laterality: N/A;  9:00 AM   COLONOSCOPY N/A 03/07/2019   Procedure: COLONOSCOPY;  Surgeon: Daneil Dolin, MD;  Location: [facility];  Service: Endoscopy;  Laterality: N/A;  1:00   POLYPECTOMY N/A 06/20/2015   Procedure: POLYPECTOMY;  Surgeon: Daneil Dolin, MD;  Location: [facility];  Service: Endoscopy;  Laterality: N/A;  Hepatic flexure polyps x 2/ Polyp opposite ileocecal valve   POLYPECTOMY  03/07/2019   Procedure: POLYPECTOMY;  Surgeon: Daneil Dolin, MD;  Location: [facility];  Service:  Endoscopy;;  transverse   TOTAL KNEE ARTHROPLASTY Right 05/09/2018   Procedure: TOTAL KNEE ARTHROPLASTY;  Surgeon: Paralee Cancel, MD;  Location: [facility];  Service: Orthopedics;  Laterality: Right;  70 mins    No current facility-administered medications for this encounter.   Current Outpatient Medications  Medication Sig Dispense Refill Last Dose   amLODipine (NORVASC) 10 MG tablet Take 10 mg by mouth daily.      Ascorbic Acid (VITAMIN C) 1000 MG tablet Take 1,000 mg by mouth daily.      aspirin EC 81 MG tablet Take 81 mg by mouth daily.      Cholecalciferol (VITAMIN D) 50 MCG (2000 UT) tablet Take 2,000 Units by mouth daily.      diclofenac Sodium (VOLTAREN) 1 % GEL Apply 1 application topically daily as needed (pain).      DM-APAP-CPM (CORICIDIN HBP PO) Take 2 capsules by mouth every 6 (six) hours as needed (cold.cough).      Ferrous Sulfate 142 (45 Fe) MG TBCR Take 142 mg by mouth daily.      glipiZIDE (GLUCOTROL) 5 MG tablet Take 2.5 mg by mouth daily.      Krill Oil 1000 MG CAPS Take 1,000 mg by mouth daily.      Liniments (SALONPAS PAIN RELIEF PATCH EX) Apply 1 application topically daily as needed (left knee).       lovastatin (MEVACOR) 10 MG tablet Take 10 mg by mouth at bedtime.       Multiple  Vitamins-Minerals (MULTIVITAMINS THER. W/MINERALS) TABS tablet Take 2 tablets by mouth daily.       potassium chloride (K-DUR) 10 MEQ tablet Take 10 mEq by mouth 2 (two) times daily.      valsartan (DIOVAN) 160 MG tablet Take 160 mg by mouth daily.      No Known Allergies  Social History   Tobacco Use   Smoking status: Never   Smokeless tobacco: Never  Substance Use Topics   Alcohol use: Yes    Comment: Occasional glass of wine    No family history on file.   Review of Systems  Constitutional:  Negative for chills and fever.  Respiratory:  Negative for cough and shortness of breath.   Cardiovascular:  Negative for chest pain.  Gastrointestinal:  Negative for nausea and vomiting.   Musculoskeletal:  Positive for arthralgias.   Objective:  Physical Exam Well nourished and well developed. General: Alert and oriented x3, cooperative and pleasant, no acute distress. Head: normocephalic, atraumatic, neck supple. Eyes: EOMI. Musculoskeletal: Left knee exam: Varus left knee. No effusion. Tenderness medially. Slight flexion contracture but flexion to 120 with tightness Calves soft and nontender. Motor function intact in LE. Strength 5/5 LE bilaterally. Neuro: Distal pulses 2+. Sensation to light touch intact in LE.  Vital signs in last 24 hours:    Labs:   Estimated body mass index is 25.34 kg/m as calculated from the following:   Height as of 11/19/20: '5\' 6"'$  (1.676 m).   Weight as of 11/19/20: 71.2 kg.   Imaging Review Plain radiographs demonstrate severe degenerative joint disease of the left knee(s). The overall alignment isneutral. The bone quality appears to be adequate for age and reported activity level.      Assessment/Plan:  End stage arthritis, left knee   The patient history, physical examination, clinical judgment of the provider and imaging studies are consistent with end stage degenerative joint disease of the left knee(s) and total knee arthroplasty is deemed medically necessary. The treatment options including medical management, injection therapy arthroscopy and arthroplasty were discussed at length. The risks and benefits of total knee arthroplasty were presented and reviewed. The risks due to aseptic loosening, infection, stiffness, patella tracking problems, thromboembolic complications and other imponderables were discussed. The patient acknowledged the explanation, agreed to proceed with the plan and consent was signed. Patient is being admitted for inpatient treatment for surgery, pain control, PT, OT, prophylactic antibiotics, VTE prophylaxis, progressive ambulation and ADL's and discharge planning. The patient is planning to be discharged   home.  Therapy Plans: outpatient therapy at [facility] in [place] Disposition: Home with sister Planned DVT Prophylaxis: aspirin '81mg'$  BID DME needed: none PCP: Dr. Allyn Kenner, clearance received TXA: IV Allergies: NKDA Anesthesia Concerns: none BMI: 25.9 Last HgbA1c: 5.6%  Other: - Norco, robaxin, celebrex - staying overnight.   Patient's anticipated LOS is less than 2 midnights, meeting these requirements: - Younger than 12 - Lives within 1 hour of care - Has a competent adult at home to recover with post-op recover - NO history of  - Chronic pain requiring opiods  - Diabetes  - Coronary Artery Disease  - Heart failure  - Heart attack  - Stroke  - DVT/VTE  - Cardiac arrhythmia  - Respiratory Failure/COPD  - Renal failure  - Anemia  - Advanced Liver disease  Griffith Citron, PA-C Orthopedic Surgery [facility] [place] [phone]

## 2020-11-21 ENCOUNTER — Other Ambulatory Visit (HOSPITAL_COMMUNITY)
Admission: RE | Admit: 2020-11-21 | Discharge: 2020-11-21 | Disposition: A | Discharge status: Home / Self Care | Payer: Medicare Other | Source: Ambulatory Visit | Attending: Orthopedic Surgery | Admitting: Orthopedic Surgery

## 2020-11-21 DIAGNOSIS — Z20822 Contact with and (suspected) exposure to covid-19: Secondary | ICD-10-CM | POA: Insufficient documentation

## 2020-11-21 DIAGNOSIS — Z01812 Encounter for preprocedural laboratory examination: Secondary | ICD-10-CM | POA: Diagnosis not present

## 2020-11-21 LAB — SARS CORONAVIRUS 2 (TAT 6-24 HRS): SARS Coronavirus 2: NEGATIVE

## 2020-11-25 ENCOUNTER — Encounter (HOSPITAL_COMMUNITY): Payer: Self-pay | Admitting: Orthopedic Surgery

## 2020-11-25 ENCOUNTER — Encounter (HOSPITAL_COMMUNITY)
Admission: RE | Disposition: A | Discharge status: Home / Self Care | Payer: Self-pay | Source: Home / Self Care | Attending: Orthopedic Surgery

## 2020-11-25 ENCOUNTER — Observation Stay (HOSPITAL_COMMUNITY)
Admission: RE | Admit: 2020-11-25 | Discharge: 2020-11-26 | Disposition: A | Discharge status: Home / Self Care | Payer: Medicare Other | Attending: Orthopedic Surgery | Admitting: Orthopedic Surgery

## 2020-11-25 ENCOUNTER — Ambulatory Visit (HOSPITAL_COMMUNITY): Payer: Medicare Other | Admitting: Anesthesiology

## 2020-11-25 ENCOUNTER — Other Ambulatory Visit: Payer: Self-pay

## 2020-11-25 DIAGNOSIS — Z96652 Presence of left artificial knee joint: Secondary | ICD-10-CM

## 2020-11-25 DIAGNOSIS — E7800 Pure hypercholesterolemia, unspecified: Secondary | ICD-10-CM | POA: Diagnosis not present

## 2020-11-25 DIAGNOSIS — E119 Type 2 diabetes mellitus without complications: Secondary | ICD-10-CM | POA: Insufficient documentation

## 2020-11-25 DIAGNOSIS — Z96651 Presence of right artificial knee joint: Secondary | ICD-10-CM | POA: Insufficient documentation

## 2020-11-25 DIAGNOSIS — D649 Anemia, unspecified: Secondary | ICD-10-CM | POA: Diagnosis not present

## 2020-11-25 DIAGNOSIS — Z7982 Long term (current) use of aspirin: Secondary | ICD-10-CM | POA: Diagnosis not present

## 2020-11-25 DIAGNOSIS — I10 Essential (primary) hypertension: Secondary | ICD-10-CM | POA: Insufficient documentation

## 2020-11-25 DIAGNOSIS — Z7984 Long term (current) use of oral hypoglycemic drugs: Secondary | ICD-10-CM | POA: Diagnosis not present

## 2020-11-25 DIAGNOSIS — Z79899 Other long term (current) drug therapy: Secondary | ICD-10-CM | POA: Insufficient documentation

## 2020-11-25 DIAGNOSIS — J45909 Unspecified asthma, uncomplicated: Secondary | ICD-10-CM | POA: Insufficient documentation

## 2020-11-25 DIAGNOSIS — G8918 Other acute postprocedural pain: Secondary | ICD-10-CM | POA: Diagnosis not present

## 2020-11-25 DIAGNOSIS — M1712 Unilateral primary osteoarthritis, left knee: Principal | ICD-10-CM | POA: Insufficient documentation

## 2020-11-25 HISTORY — PX: TOTAL KNEE ARTHROPLASTY: SHX125

## 2020-11-25 LAB — GLUCOSE, CAPILLARY
Glucose-Capillary: 125 mg/dL — ABNORMAL HIGH (ref 70–99)
Glucose-Capillary: 148 mg/dL — ABNORMAL HIGH (ref 70–99)
Glucose-Capillary: 257 mg/dL — ABNORMAL HIGH (ref 70–99)

## 2020-11-25 LAB — TYPE AND SCREEN
ABO/RH(D): O POS
Antibody Screen: NEGATIVE

## 2020-11-25 SURGERY — ARTHROPLASTY, KNEE, TOTAL
Anesthesia: Spinal | Site: Knee | Laterality: Left

## 2020-11-25 MED ORDER — ORAL CARE MOUTH RINSE: 15.0000 mL | Freq: Once | OROMUCOSAL | Status: AC

## 2020-11-25 MED ORDER — HYDROCODONE-ACETAMINOPHEN 5-325 MG PO TABS: 1.0000 | ORAL_TABLET | ORAL | Status: DC | PRN

## 2020-11-25 MED ORDER — PHENOL 1.4 % MT LIQD: 1.0000 | OROMUCOSAL | Status: DC | PRN

## 2020-11-25 MED ORDER — KETOROLAC TROMETHAMINE 30 MG/ML IJ SOLN
INTRAMUSCULAR | Status: DC | PRN
  Administered 2020-11-25: 30 mg via INTRA_ARTICULAR

## 2020-11-25 MED ORDER — DIPHENHYDRAMINE HCL 12.5 MG/5ML PO ELIX: 12.5000 mg | ORAL_SOLUTION | ORAL | Status: DC | PRN

## 2020-11-25 MED ORDER — BUPIVACAINE-EPINEPHRINE (PF) 0.5% -1:200000 IJ SOLN
INTRAMUSCULAR | Status: DC | PRN
  Administered 2020-11-25: 20 mL via PERINEURAL

## 2020-11-25 MED ORDER — KETOROLAC TROMETHAMINE 30 MG/ML IJ SOLN
INTRAMUSCULAR | Status: AC
  Filled 2020-11-25: qty 1

## 2020-11-25 MED ORDER — LACTATED RINGERS IV SOLN: INTRAVENOUS | Status: DC

## 2020-11-25 MED ORDER — POTASSIUM CHLORIDE ER 10 MEQ PO TBCR
10.0000 meq | EXTENDED_RELEASE_TABLET | Freq: Two times a day (BID) | ORAL | Status: DC
  Administered 2020-11-25 – 2020-11-26 (×2): 10 meq via ORAL
  Filled 2020-11-25 (×4): qty 1

## 2020-11-25 MED ORDER — SODIUM CHLORIDE 0.9 % IV SOLN: INTRAVENOUS | Status: DC

## 2020-11-25 MED ORDER — SODIUM CHLORIDE (PF) 0.9 % IJ SOLN
INTRAMUSCULAR | Status: AC
  Filled 2020-11-25: qty 30

## 2020-11-25 MED ORDER — FERROUS SULFATE 325 (65 FE) MG PO TABS
325.0000 mg | ORAL_TABLET | Freq: Three times a day (TID) | ORAL | Status: DC
  Administered 2020-11-25 – 2020-11-26 (×3): 325 mg via ORAL
  Filled 2020-11-25 (×3): qty 1

## 2020-11-25 MED ORDER — ONDANSETRON HCL 4 MG/2ML IJ SOLN: 4.0000 mg | Freq: Four times a day (QID) | INTRAMUSCULAR | Status: DC | PRN

## 2020-11-25 MED ORDER — INSULIN ASPART 100 UNIT/ML IJ SOLN
0.0000 [IU] | Freq: Three times a day (TID) | INTRAMUSCULAR | Status: DC
  Administered 2020-11-26 (×2): 3 [IU] via SUBCUTANEOUS

## 2020-11-25 MED ORDER — SODIUM CHLORIDE 0.9 % IR SOLN
Status: DC | PRN
  Administered 2020-11-25: 1000 mL

## 2020-11-25 MED ORDER — PHENYLEPHRINE HCL (PRESSORS) 10 MG/ML IV SOLN
INTRAVENOUS | Status: AC
  Filled 2020-11-25: qty 1

## 2020-11-25 MED ORDER — FENTANYL CITRATE (PF) 100 MCG/2ML IJ SOLN
INTRAMUSCULAR | Status: AC
  Administered 2020-11-25: 50 ug
  Filled 2020-11-25: qty 2

## 2020-11-25 MED ORDER — ASPIRIN 81 MG PO CHEW
81.0000 mg | CHEWABLE_TABLET | Freq: Two times a day (BID) | ORAL | Status: DC
  Administered 2020-11-25 – 2020-11-26 (×2): 81 mg via ORAL
  Filled 2020-11-25 (×2): qty 1

## 2020-11-25 MED ORDER — DEXAMETHASONE SODIUM PHOSPHATE 10 MG/ML IJ SOLN
10.0000 mg | Freq: Once | INTRAMUSCULAR | Status: AC
  Administered 2020-11-26: 10 mg via INTRAVENOUS
  Filled 2020-11-25: qty 1

## 2020-11-25 MED ORDER — METHOCARBAMOL 500 MG PO TABS: 500.0000 mg | ORAL_TABLET | Freq: Four times a day (QID) | ORAL | Status: DC | PRN

## 2020-11-25 MED ORDER — STERILE WATER FOR IRRIGATION IR SOLN
Status: DC | PRN
  Administered 2020-11-25: 2000 mL

## 2020-11-25 MED ORDER — ONDANSETRON HCL 4 MG PO TABS: 4.0000 mg | ORAL_TABLET | Freq: Four times a day (QID) | ORAL | Status: DC | PRN

## 2020-11-25 MED ORDER — METHOCARBAMOL 1000 MG/10ML IJ SOLN
500.0000 mg | Freq: Four times a day (QID) | INTRAVENOUS | Status: DC | PRN
  Filled 2020-11-25 (×2): qty 5

## 2020-11-25 MED ORDER — MORPHINE SULFATE (PF) 2 MG/ML IV SOLN: 0.5000 mg | INTRAVENOUS | Status: DC | PRN

## 2020-11-25 MED ORDER — 0.9 % SODIUM CHLORIDE (POUR BTL) OPTIME
TOPICAL | Status: DC | PRN
  Administered 2020-11-25: 1000 mL

## 2020-11-25 MED ORDER — CEFAZOLIN SODIUM-DEXTROSE 2-4 GM/100ML-% IV SOLN
2.0000 g | Freq: Four times a day (QID) | INTRAVENOUS | Status: AC
  Administered 2020-11-25 – 2020-11-26 (×2): 2 g via INTRAVENOUS
  Filled 2020-11-25 (×2): qty 100

## 2020-11-25 MED ORDER — BUPIVACAINE-EPINEPHRINE (PF) 0.25% -1:200000 IJ SOLN
INTRAMUSCULAR | Status: DC | PRN
  Administered 2020-11-25: 30 mL

## 2020-11-25 MED ORDER — ACETAMINOPHEN 325 MG PO TABS: 325.0000 mg | ORAL_TABLET | Freq: Four times a day (QID) | ORAL | Status: DC | PRN

## 2020-11-25 MED ORDER — AMLODIPINE BESYLATE 10 MG PO TABS
10.0000 mg | ORAL_TABLET | Freq: Every day | ORAL | Status: DC
  Administered 2020-11-26: 10 mg via ORAL
  Filled 2020-11-25: qty 1

## 2020-11-25 MED ORDER — POLYETHYLENE GLYCOL 3350 17 G PO PACK: 17.0000 g | PACK | Freq: Every day | ORAL | Status: DC | PRN

## 2020-11-25 MED ORDER — ONDANSETRON HCL 4 MG/2ML IJ SOLN
INTRAMUSCULAR | Status: DC | PRN
  Administered 2020-11-25: 4 mg via INTRAVENOUS

## 2020-11-25 MED ORDER — PRAVASTATIN SODIUM 20 MG PO TABS: 10.0000 mg | ORAL_TABLET | Freq: Every day | ORAL | Status: DC

## 2020-11-25 MED ORDER — HYDROCODONE-ACETAMINOPHEN 7.5-325 MG PO TABS
1.0000 | ORAL_TABLET | ORAL | Status: DC | PRN
  Administered 2020-11-25 – 2020-11-26 (×4): 1 via ORAL
  Administered 2020-11-26 (×2): 2 via ORAL
  Filled 2020-11-25: qty 1
  Filled 2020-11-25: qty 2
  Filled 2020-11-25 (×5): qty 1

## 2020-11-25 MED ORDER — LIDOCAINE 2% (20 MG/ML) 5 ML SYRINGE
INTRAMUSCULAR | Status: DC | PRN
  Administered 2020-11-25: 40 mg via INTRAVENOUS

## 2020-11-25 MED ORDER — PHENYLEPHRINE HCL-NACL 20-0.9 MG/250ML-% IV SOLN
INTRAVENOUS | Status: DC | PRN
  Administered 2020-11-25: 20 ug/min via INTRAVENOUS

## 2020-11-25 MED ORDER — DOCUSATE SODIUM 100 MG PO CAPS
100.0000 mg | ORAL_CAPSULE | Freq: Two times a day (BID) | ORAL | Status: DC
  Administered 2020-11-25 – 2020-11-26 (×2): 100 mg via ORAL
  Filled 2020-11-25 (×2): qty 1

## 2020-11-25 MED ORDER — FENTANYL CITRATE (PF) 100 MCG/2ML IJ SOLN: 25.0000 ug | INTRAMUSCULAR | Status: DC | PRN

## 2020-11-25 MED ORDER — GLIPIZIDE 5 MG PO TABS
2.5000 mg | ORAL_TABLET | Freq: Every day | ORAL | Status: DC
  Administered 2020-11-26: 2.5 mg via ORAL
  Filled 2020-11-25: qty 1

## 2020-11-25 MED ORDER — POVIDONE-IODINE 10 % EX SWAB
2.0000 "application " | Freq: Once | CUTANEOUS | Status: AC
  Administered 2020-11-25: 2 via TOPICAL

## 2020-11-25 MED ORDER — METOCLOPRAMIDE HCL 5 MG/ML IJ SOLN: 5.0000 mg | Freq: Three times a day (TID) | INTRAMUSCULAR | Status: DC | PRN

## 2020-11-25 MED ORDER — IRBESARTAN 150 MG PO TABS
150.0000 mg | ORAL_TABLET | Freq: Every day | ORAL | Status: DC
  Administered 2020-11-26: 150 mg via ORAL
  Filled 2020-11-25: qty 1

## 2020-11-25 MED ORDER — MENTHOL 3 MG MT LOZG: 1.0000 | LOZENGE | OROMUCOSAL | Status: DC | PRN

## 2020-11-25 MED ORDER — TRANEXAMIC ACID-NACL 1000-0.7 MG/100ML-% IV SOLN
1000.0000 mg | INTRAVENOUS | Status: DC
  Filled 2020-11-25: qty 100

## 2020-11-25 MED ORDER — DEXAMETHASONE SODIUM PHOSPHATE 10 MG/ML IJ SOLN
8.0000 mg | Freq: Once | INTRAMUSCULAR | Status: AC
Start: 2020-11-25 — End: 2020-11-25
  Administered 2020-11-25: 8 mg via INTRAVENOUS

## 2020-11-25 MED ORDER — CEFAZOLIN SODIUM-DEXTROSE 2-4 GM/100ML-% IV SOLN
2.0000 g | INTRAVENOUS | Status: AC
  Administered 2020-11-25: 2 g via INTRAVENOUS
  Filled 2020-11-25: qty 100

## 2020-11-25 MED ORDER — CELECOXIB 200 MG PO CAPS
200.0000 mg | ORAL_CAPSULE | Freq: Two times a day (BID) | ORAL | Status: DC
  Administered 2020-11-25 – 2020-11-26 (×2): 200 mg via ORAL
  Filled 2020-11-25 (×2): qty 1

## 2020-11-25 MED ORDER — PROPOFOL 10 MG/ML IV BOLUS
INTRAVENOUS | Status: DC | PRN
  Administered 2020-11-25: 20 mg via INTRAVENOUS
  Administered 2020-11-25: 30 mg via INTRAVENOUS

## 2020-11-25 MED ORDER — MIDAZOLAM HCL 2 MG/2ML IJ SOLN
INTRAMUSCULAR | Status: AC
  Filled 2020-11-25: qty 2

## 2020-11-25 MED ORDER — STERILE WATER FOR INJECTION IJ SOLN
INTRAMUSCULAR | Status: AC
  Filled 2020-11-25: qty 10

## 2020-11-25 MED ORDER — PROPOFOL 1000 MG/100ML IV EMUL
INTRAVENOUS | Status: AC
  Filled 2020-11-25: qty 300

## 2020-11-25 MED ORDER — TRANEXAMIC ACID-NACL 1000-0.7 MG/100ML-% IV SOLN
1000.0000 mg | Freq: Once | INTRAVENOUS | Status: AC
  Administered 2020-11-25: 1000 mg via INTRAVENOUS
  Filled 2020-11-25: qty 100

## 2020-11-25 MED ORDER — BUPIVACAINE IN DEXTROSE 0.75-8.25 % IT SOLN
INTRATHECAL | Status: DC | PRN
  Administered 2020-11-25: 1.6 mL via INTRATHECAL

## 2020-11-25 MED ORDER — BISACODYL 10 MG RE SUPP: 10.0000 mg | Freq: Every day | RECTAL | Status: DC | PRN

## 2020-11-25 MED ORDER — PROPOFOL 500 MG/50ML IV EMUL
INTRAVENOUS | Status: DC | PRN
  Administered 2020-11-25: 80 ug/kg/min via INTRAVENOUS

## 2020-11-25 MED ORDER — ACETAMINOPHEN 10 MG/ML IV SOLN
INTRAVENOUS | Status: AC
  Filled 2020-11-25: qty 100

## 2020-11-25 MED ORDER — BUPIVACAINE-EPINEPHRINE (PF) 0.25% -1:200000 IJ SOLN
INTRAMUSCULAR | Status: AC
  Filled 2020-11-25: qty 30

## 2020-11-25 MED ORDER — METOCLOPRAMIDE HCL 5 MG PO TABS: 5.0000 mg | ORAL_TABLET | Freq: Three times a day (TID) | ORAL | Status: DC | PRN

## 2020-11-25 MED ORDER — TRANEXAMIC ACID-NACL 1000-0.7 MG/100ML-% IV SOLN
INTRAVENOUS | Status: DC | PRN
  Administered 2020-11-25: 1000 mg via INTRAVENOUS

## 2020-11-25 MED ORDER — CHLORHEXIDINE GLUCONATE 0.12 % MT SOLN
15.0000 mL | Freq: Once | OROMUCOSAL | Status: AC
  Administered 2020-11-25: 15 mL via OROMUCOSAL

## 2020-11-25 SURGICAL SUPPLY — 52 items
ATTUNE MED ANAT PAT 35 KNEE (Knees) ×2 IMPLANT
ATTUNE PS FEM LT SZ 3 CEM KNEE (Femur) ×2 IMPLANT
BAG COUNTER SPONGE SURGICOUNT (BAG) ×2 IMPLANT
BAG ZIPLOCK 12X15 (MISCELLANEOUS) IMPLANT
BASE TIBIAL ROT PLAT SZ 5 KNEE (Knees) ×1 IMPLANT
BLADE SAW SGTL 11.0X1.19X90.0M (BLADE) ×2 IMPLANT
BLADE SAW SGTL 13.0X1.19X90.0M (BLADE) ×2 IMPLANT
BLADE SURG SZ10 CARB STEEL (BLADE) IMPLANT
BNDG ELASTIC 6X5.8 VLCR STR LF (GAUZE/BANDAGES/DRESSINGS) ×2 IMPLANT
BOWL SMART MIX CTS (DISPOSABLE) ×2 IMPLANT
CEMENT HV SMART SET (Cement) ×4 IMPLANT
CUFF TOURN SGL QUICK 34 (TOURNIQUET CUFF) ×1
CUFF TRNQT CYL 34X4.125X (TOURNIQUET CUFF) ×1 IMPLANT
DECANTER SPIKE VIAL GLASS SM (MISCELLANEOUS) ×4 IMPLANT
DERMABOND ADVANCED (GAUZE/BANDAGES/DRESSINGS) ×1
DERMABOND ADVANCED .7 DNX12 (GAUZE/BANDAGES/DRESSINGS) ×1 IMPLANT
DRAPE U-SHAPE 47X51 STRL (DRAPES) ×2 IMPLANT
DRESSING AQUACEL AG SP 3.5X10 (GAUZE/BANDAGES/DRESSINGS) ×1 IMPLANT
DRSG AQUACEL AG SP 3.5X10 (GAUZE/BANDAGES/DRESSINGS) ×2
DURAPREP 26ML APPLICATOR (WOUND CARE) ×4 IMPLANT
ELECT REM PT RETURN 15FT ADLT (MISCELLANEOUS) ×2 IMPLANT
GLOVE SURG ENC MOIS LTX SZ6 (GLOVE) IMPLANT
GLOVE SURG UNDER LTX SZ7.5 (GLOVE) ×2 IMPLANT
GLOVE SURG UNDER POLY LF SZ6.5 (GLOVE) IMPLANT
GLOVE SURG UNDER POLY LF SZ7.5 (GLOVE) ×2 IMPLANT
GOWN STRL REUS W/TWL LRG LVL3 (GOWN DISPOSABLE) ×2 IMPLANT
HANDPIECE INTERPULSE COAX TIP (DISPOSABLE) ×1
HOLDER FOLEY CATH W/STRAP (MISCELLANEOUS) ×2 IMPLANT
INSERT TIB ATTUNE RP SZ3X14 (Insert) ×2 IMPLANT
KIT TURNOVER KIT A (KITS) ×2 IMPLANT
MANIFOLD NEPTUNE II (INSTRUMENTS) ×2 IMPLANT
NDL SAFETY ECLIPSE 18X1.5 (NEEDLE) ×1 IMPLANT
NEEDLE HYPO 18GX1.5 SHARP (NEEDLE) ×1
NS IRRIG 1000ML POUR BTL (IV SOLUTION) ×2 IMPLANT
PACK TOTAL KNEE CUSTOM (KITS) ×2 IMPLANT
PENCIL SMOKE EVACUATOR (MISCELLANEOUS) IMPLANT
PIN DRILL FIX HALF THREAD (BIT) ×2 IMPLANT
PIN FIX SIGMA LCS THRD HI (PIN) ×2 IMPLANT
PROTECTOR NERVE ULNAR (MISCELLANEOUS) ×2 IMPLANT
SET HNDPC FAN SPRY TIP SCT (DISPOSABLE) ×1 IMPLANT
SET PAD KNEE POSITIONER (MISCELLANEOUS) ×2 IMPLANT
SUT MNCRL AB 4-0 PS2 18 (SUTURE) ×2 IMPLANT
SUT STRATAFIX PDS+ 0 24IN (SUTURE) ×2 IMPLANT
SUT VIC AB 1 CT1 36 (SUTURE) ×2 IMPLANT
SUT VIC AB 2-0 CT1 27 (SUTURE) ×3
SUT VIC AB 2-0 CT1 TAPERPNT 27 (SUTURE) ×3 IMPLANT
SYR 3ML LL SCALE MARK (SYRINGE) ×2 IMPLANT
TIBIAL BASE ROT PLAT SZ 5 KNEE (Knees) ×2 IMPLANT
TRAY FOLEY MTR SLVR 14FR STAT (SET/KITS/TRAYS/PACK) ×2 IMPLANT
TUBE SUCTION HIGH CAP CLEAR NV (SUCTIONS) ×2 IMPLANT
WATER STERILE IRR 1000ML POUR (IV SOLUTION) ×4 IMPLANT
WRAP KNEE MAXI GEL POST OP (GAUZE/BANDAGES/DRESSINGS) ×2 IMPLANT

## 2020-11-25 NOTE — Op Note (Signed)
NAME:  Sylvia Sparks                      MEDICAL RECORD NO.:  [id number]                             FACILITY:  [facility]      PHYSICIAN:  Pietro Cassis. Alvan Dame, M.D.  DATE OF BIRTH:  [birth year]      DATE OF PROCEDURE:  11/25/2020                                     OPERATIVE REPORT         PREOPERATIVE DIAGNOSIS:  Left knee osteoarthritis.      POSTOPERATIVE DIAGNOSIS:  Left knee osteoarthritis.      FINDINGS:  The patient was noted to have complete loss of cartilage and   bone-on-bone arthritis with associated osteophytes in the medial and patellofemoral compartments of   the knee with a significant synovitis and associated effusion.  Her proximal tibia had significant medial deformity related to the arthritic wear pattern.  The patient had failed months of conservative treatment including medications, injection therapy, activity modification.     PROCEDURE:  Left total knee replacement.      COMPONENTS USED:  DePuy Attune rotating platform posterior stabilized knee   system, a size 3 femur, 5 tibia, 14 mm PS AOX insert, and 32 anatomic patellar   button.      SURGEON:  Pietro Cassis. Alvan Dame, M.D.      ASSISTANT:  Costella Hatcher, PA-C.      ANESTHESIA:  Regional and Spinal.      SPECIMENS:  None.      COMPLICATION:  None.      DRAINS:  None.  EBL: <50 cc      TOURNIQUET TIME:   Total Tourniquet Time Documented: Thigh (Left) - 34 minutes Total: Thigh (Left) - 34 minutes  .      The patient was stable to the recovery room.      INDICATION FOR PROCEDURE:  Sylvia Sparks is a 75 y.o. female patient of   mine.  The patient had been seen, evaluated, and treated for months conservatively in the   office with medication, activity modification, and injections.  The patient had   radiographic changes of bone-on-bone arthritis with endplate sclerosis and osteophytes noted.  Based on the radiographic changes and failed conservative measures, the patient   decided to proceed with  definitive treatment, total knee replacement.  Risks of infection, DVT, component failure, need for revision surgery, neurovascular injury were reviewed in the office setting.  The postop course was reviewed stressing the efforts to maximize post-operative satisfaction and function.  Consent was obtained for benefit of pain   relief.      PROCEDURE IN DETAIL:  The patient was brought to the operative theater.   Once adequate anesthesia, preoperative antibiotics, 2 gm of Ancef,1 gm of Tranexamic Acid, and 10 mg of Decadron administered, the patient was positioned supine with a left thigh tourniquet placed.  The  left lower extremity was prepped and draped in sterile fashion.  A time-   out was performed identifying the patient, planned procedure, and the appropriate extremity.      The left lower extremity was placed in the [facility] leg holder.  The leg was   exsanguinated, tourniquet  elevated to 250 mmHg.  A midline incision was   made followed by median parapatellar arthrotomy.  Following initial   exposure, attention was first directed to the patella.  Precut   measurement was noted to be 22 mm.  I resected down to 13-14 mm and used a   32 anatomic patellar button to restore patellar height as well as cover the cut surface.      The lug holes were drilled and a metal shim was placed to protect the   patella from retractors and saw blade during the procedure.      At this point, attention was now directed to the femur.  The femoral   canal was opened with a drill, irrigated to try to prevent fat emboli.  An   intramedullary rod was passed at 3 degrees valgus, 9 mm of bone was   resected off the distal femur.  Following this resection, the tibia was   subluxated anteriorly.  Using the extramedullary guide,  2 mm of bone was resected off   the mid sagital portion of the proximal medial tibia due to the significant medial slpoe.  We confirmed the gap would be   stable medially and laterally with  a size 6 spacer block as well as confirmed that the tibial cut was perpendicular in the coronal plane, checking with an alignment rod.      Once this was done, I sized the femur to be a size 4 in the anterior-   posterior dimension, chose a standard component based on medial and   lateral dimension.  The size 4 rotation block was then pinned in   position anterior referenced using the C-clamp to set rotation.  The   anterior, posterior, and  chamfer cuts were made without difficulty nor   notching making certain that I was along the anterior cortex to help   with flexion gap stability.      The final box cut was made off the lateral aspect of distal femur.      At this point, the tibia was sized to be a size 5.  The size 5 tray was   then pinned in position through the medial third of the tubercle,   drilled, and keel punched.  Trial reduction was now carried with a 4 femur,  5 tibia, a size 8 mm PS insert, and the 32 anatomic patella botton.  The knee was found to hyperextend but was tight in flexion.  Given these findings I removed her femoral trial and downsized the femoral to the size 3.  The 4-in-1 cutting block was pinned into place and the four cuts revisited particularly posteriorly.  Was this was performed the size 3 femoral trial was impacted onto the distal femur.  This time I was able to balance her knee into flexion and extension with a 14 insert.  The patella tracked through the trochlea without application of pressure.  Given   all these findings the trial components removed.  Final components were   opened and cement was mixed.  The knee was irrigated with normal saline solution and pulse lavage.  The synovial lining was   then injected with 30 cc of 0.25% Marcaine with epinephrine, 1 cc of Toradol and 30 cc of NS for a total of 61 cc.     Final implants were then cemented onto cleaned and dried cut surfaces of bone with the knee brought to extension with a size 14 mm PS trial  insert.      Once the cement had fully cured, excess cement was removed   throughout the knee.  I confirmed that I was satisfied with the range of   motion and stability, and the final size 14 mm PS AOX insert was chosen.  It was   placed into the knee.      The tourniquet had been let down at 34 minutes.  No significant   hemostasis was required.  The extensor mechanism was then reapproximated using #1 Vicryl and #1 Stratafix sutures with the knee   in flexion.  The   remaining wound was closed with 2-0 Vicryl and running 4-0 Monocryl.   The knee was cleaned, dried, dressed sterilely using Dermabond and   Aquacel dressing.  The patient was then   brought to recovery room in stable condition, tolerating the procedure   well.   Please note that Physician Assistant, Costella Hatcher, PA-C was present for the entirety of the case, and was utilized for pre-operative positioning, peri-operative retractor management, general facilitation of the procedure and for primary wound closure at the end of the case.              Pietro Cassis Alvan Dame, M.D.    11/25/2020 2:58 PM

## 2020-11-25 NOTE — Transfer of Care (Signed)
Immediate Anesthesia Transfer of Care Note  Patient: Sylvia Sparks  Procedure(s) Performed: TOTAL KNEE ARTHROPLASTY (Left: Knee)  Patient Location: PACU  Anesthesia Type:Spinal  Level of Consciousness: drowsy  Airway & Oxygen Therapy: Patient Spontanous Breathing and Patient connected to nasal cannula oxygen  Post-op Assessment: Report given to RN and Post -op Vital signs reviewed and stable  Post vital signs: Reviewed and stable  Last Vitals:  Vitals Value Taken Time  BP    Temp    Pulse    Resp    SpO2      Last Pain:  Vitals:   11/25/20 1115  TempSrc:   PainSc: 0-No pain      Patients Stated Pain Goal: 4 ([id number])  Complications: No notable events documented.

## 2020-11-25 NOTE — Plan of Care (Signed)
  Problem: Pain Management: Goal: Pain level will decrease with appropriate interventions Outcome: Progressing   

## 2020-11-25 NOTE — Care Plan (Signed)
Ortho Bundle Case Management Note  Patient Details  Name: Fatmah Galas MRN: [id number] Date of Birth: 05/20/1945                  L TKA on 11/25/20. DCP: Home with sister. 2 story home with 5 steps. DME: No needs. Has RW & 3in1. PT: [facility] [place] 8/5   DME Arranged:  N/A DME Agency:     HH Arranged:    [place] Agency:     Additional Comments: Please contact me with any questions of if this plan should need to change.  Marianne Sofia, RN,CCM [facility]  [phone] 11/25/2020, 12:03 PM

## 2020-11-25 NOTE — Anesthesia Procedure Notes (Signed)
Spinal  Patient location during procedure: OR Start time: 11/25/2020 1:32 PM End time: 11/25/2020 1:36 PM Reason for block: surgical anesthesia Staffing Performed: resident/CRNA  Resident/CRNA: Milford Cage, CRNA Preanesthetic Checklist Completed: patient identified, IV checked, site marked, risks and benefits discussed, surgical consent, monitors and equipment checked, pre-op evaluation and timeout performed Spinal Block Patient position: sitting Prep: ChloraPrep Patient monitoring: heart rate, cardiac monitor, continuous pulse ox and blood pressure Approach: midline Location: L3-4 Injection technique: single-shot Needle Needle type: Pencan  Needle gauge: 24 G Needle length: 10 cm Assessment Sensory level: T6 Additional Notes Functioning IV was confirmed and monitors were applied. Sterile prep and drape, including hand hygiene and sterile gloves were used. The patient was positioned and the spine was prepped. The skin was anesthetized with lidocaine.  Free flow of clear CSF was obtained prior to injecting local anesthetic into the CSF.  The spinal needle aspirated freely following injection.  The needle was carefully withdrawn.  The patient tolerated the procedure well.

## 2020-11-25 NOTE — Anesthesia Postprocedure Evaluation (Signed)
Anesthesia Post Note  Patient: Sylvia Sparks  Procedure(s) Performed: TOTAL KNEE ARTHROPLASTY (Left: Knee)     Patient location during evaluation: PACU Anesthesia Type: Spinal and Regional Level of consciousness: oriented and awake and alert Pain management: pain level controlled Vital Signs Assessment: post-procedure vital signs reviewed and stable Respiratory status: spontaneous breathing, respiratory function stable and patient connected to nasal cannula oxygen Cardiovascular status: blood pressure returned to baseline and stable Postop Assessment: no headache, no backache, no apparent nausea or vomiting, spinal receding and patient able to bend at knees Anesthetic complications: no   No notable events documented.  Last Vitals:  Vitals:   11/25/20 1645 11/25/20 1700  BP: 139/68   Pulse: (!) 58   Resp: 11   Temp:  36.6 C  SpO2: 100%     Last Pain:  Vitals:   11/25/20 1700  TempSrc:   PainSc: Asleep                 Liani Caris,W. EDMOND

## 2020-11-25 NOTE — Anesthesia Preprocedure Evaluation (Addendum)
Anesthesia Evaluation  Patient identified by MRN, date of birth, ID band Patient awake    Reviewed: Allergy & Precautions, H&P , NPO status , Patient's Chart, lab work & pertinent test results  Airway Mallampati: II  TM Distance: >3 FB Neck ROM: Full    Dental no notable dental hx. (+) Teeth Intact, Dental Advisory Given   Pulmonary asthma ,    Pulmonary exam normal breath sounds clear to auscultation       Cardiovascular hypertension, Pt. on medications  Rhythm:Regular Rate:Normal     Neuro/Psych negative neurological ROS  negative psych ROS   GI/Hepatic negative GI ROS, Neg liver ROS,   Endo/Other  diabetes, Type 2, Oral Hypoglycemic Agents  Renal/GU negative Renal ROS  negative genitourinary   Musculoskeletal  (+) Arthritis , Osteoarthritis,    Abdominal   Peds  Hematology  (+) Blood dyscrasia, anemia ,   Anesthesia Other Findings   Reproductive/Obstetrics negative OB ROS                            Anesthesia Physical Anesthesia Plan  ASA: 2  Anesthesia Plan: Spinal   Post-op Pain Management:  Regional for Post-op pain   Induction: Intravenous  PONV Risk Score and Plan: 3 and Propofol infusion, Ondansetron and Dexamethasone  Airway Management Planned: Simple Face Mask  Additional Equipment:   Intra-op Plan:   Post-operative Plan:   Informed Consent: I have reviewed the patients History and Physical, chart, labs and discussed the procedure including the risks, benefits and alternatives for the proposed anesthesia with the patient or authorized representative who has indicated his/her understanding and acceptance.     Dental advisory given  Plan Discussed with: CRNA  Anesthesia Plan Comments:         Anesthesia Quick Evaluation

## 2020-11-25 NOTE — Progress Notes (Signed)
Assisted Dr. Edmond Fitzgerald with left, ultrasound guided, adductor canal block. Side rails up, monitors on throughout procedure. See vital signs in flow sheet. Tolerated Procedure well. 

## 2020-11-25 NOTE — Discharge Instructions (Signed)

## 2020-11-25 NOTE — Interval H&P Note (Signed)
History and Physical Interval Note:  11/25/2020 11:46 AM  Sylvia Sparks  has presented today for surgery, with the diagnosis of Left knee osteoarthritis.  The various methods of treatment have been discussed with the patient and family. After consideration of risks, benefits and other options for treatment, the patient has consented to  Procedure(s): TOTAL KNEE ARTHROPLASTY (Left) as a surgical intervention.  The patient's history has been reviewed, patient examined, no change in status, stable for surgery.  I have reviewed the patient's chart and labs.  Questions were answered to the patient's satisfaction.     Mauri Pole

## 2020-11-25 NOTE — Anesthesia Procedure Notes (Signed)
Anesthesia Regional Block: Adductor canal block   Pre-Anesthetic Checklist: , timeout performed,  Correct Patient, Correct Site, Correct Laterality,  Correct Procedure, Correct Position, site marked,  Risks and benefits discussed,  Pre-op evaluation,  At surgeon's request and post-op pain management  Laterality: Left  Prep: Maximum Sterile Barrier Precautions used, chloraprep       Needles:  Injection technique: Single-shot  Needle Type: Echogenic Stimulator Needle     Needle Length: 9cm  Needle Gauge: 21     Additional Needles:   Procedures:,,,, ultrasound used (permanent image in chart),,    Narrative:  Start time: 11/25/2020 12:02 PM End time: 11/25/2020 12:12 PM Injection made incrementally with aspirations every 5 mL.  Performed by: Personally  Anesthesiologist: Roderic Palau, MD  Additional Notes: 2% Lidocaine skin wheel.

## 2020-11-26 ENCOUNTER — Encounter (HOSPITAL_COMMUNITY): Payer: Self-pay | Admitting: Orthopedic Surgery

## 2020-11-26 DIAGNOSIS — I10 Essential (primary) hypertension: Secondary | ICD-10-CM | POA: Diagnosis not present

## 2020-11-26 DIAGNOSIS — Z96651 Presence of right artificial knee joint: Secondary | ICD-10-CM | POA: Diagnosis not present

## 2020-11-26 DIAGNOSIS — J45909 Unspecified asthma, uncomplicated: Secondary | ICD-10-CM | POA: Diagnosis not present

## 2020-11-26 DIAGNOSIS — M1712 Unilateral primary osteoarthritis, left knee: Secondary | ICD-10-CM | POA: Diagnosis not present

## 2020-11-26 DIAGNOSIS — E119 Type 2 diabetes mellitus without complications: Secondary | ICD-10-CM | POA: Diagnosis not present

## 2020-11-26 DIAGNOSIS — Z79899 Other long term (current) drug therapy: Secondary | ICD-10-CM | POA: Diagnosis not present

## 2020-11-26 LAB — BASIC METABOLIC PANEL
Anion gap: 6 (ref 5–15)
BUN: 23 mg/dL (ref 8–23)
CO2: 21 mmol/L — ABNORMAL LOW (ref 22–32)
Calcium: 9.2 mg/dL (ref 8.9–10.3)
Chloride: 109 mmol/L (ref 98–111)
Creatinine, Ser: 1.17 mg/dL — ABNORMAL HIGH (ref 0.44–1.00)
GFR, Estimated: 49 mL/min — ABNORMAL LOW (ref >60)
Glucose, Bld: 162 mg/dL — ABNORMAL HIGH (ref 70–99)
Potassium: 4.6 mmol/L (ref 3.5–5.1)
Sodium: 136 mmol/L (ref 135–145)

## 2020-11-26 LAB — CBC
HCT: 35.9 % — ABNORMAL LOW (ref 36.0–46.0)
Hemoglobin: 11.6 g/dL — ABNORMAL LOW (ref 12.0–15.0)
MCH: 29.9 pg (ref 26.0–34.0)
MCHC: 32.3 g/dL (ref 30.0–36.0)
MCV: 92.5 fL (ref 80.0–100.0)
Platelets: 262 10*3/uL (ref 150–400)
RBC: 3.88 MIL/uL (ref 3.87–5.11)
RDW: 13.3 % (ref 11.5–15.5)
WBC: 6.4 10*3/uL (ref 4.0–10.5)
nRBC: 0 % (ref 0.0–0.2)

## 2020-11-26 LAB — GLUCOSE, CAPILLARY
Glucose-Capillary: 161 mg/dL — ABNORMAL HIGH (ref 70–99)
Glucose-Capillary: 193 mg/dL — ABNORMAL HIGH (ref 70–99)

## 2020-11-26 MED ORDER — METHOCARBAMOL 500 MG PO TABS: 500.0000 mg | ORAL_TABLET | Freq: Four times a day (QID) | ORAL | 0 refills | Status: DC | PRN

## 2020-11-26 MED ORDER — ASPIRIN 81 MG PO CHEW: 81.0000 mg | CHEWABLE_TABLET | Freq: Two times a day (BID) | ORAL | 0 refills | Status: AC

## 2020-11-26 MED ORDER — DOCUSATE SODIUM 100 MG PO CAPS: 100.0000 mg | ORAL_CAPSULE | Freq: Two times a day (BID) | ORAL | 0 refills | Status: DC

## 2020-11-26 MED ORDER — POLYETHYLENE GLYCOL 3350 17 G PO PACK: 17.0000 g | PACK | Freq: Every day | ORAL | 0 refills | Status: DC | PRN

## 2020-11-26 MED ORDER — HYDROCODONE-ACETAMINOPHEN 5-325 MG PO TABS: 1.0000 | ORAL_TABLET | ORAL | 0 refills | Status: DC | PRN

## 2020-11-26 NOTE — TOC Transition Note (Signed)
Transition of Care [facility]) - CM/SW Discharge Note   Patient Details  Name: Sylvia Sparks MRN: [id number] Date of Birth: 1945/11/16  Transition of Care [facility]) CM/SW Contact:  Lennart Pall, LCSW Phone Number: 11/26/2020, 10:49 AM   Clinical Narrative:     Met briefly with pt and confirming she has all needed DME at home.  Plan for OPPT at [facility]: [facility] Rhonda W.  No further TOC needs.  Final next level of care: OP Rehab Barriers to Discharge: No Barriers Identified   Patient Goals and CMS Choice Patient states their goals for this hospitalization and ongoing recovery are:: return home CMS Medicare.gov Compare Post Acute Care list provided to:: Patient    Discharge Placement                       Discharge Plan and Services                DME Arranged: N/A                    Social Determinants of Health (SDOH) Interventions     Readmission Risk Interventions No flowsheet data found.

## 2020-11-26 NOTE — Evaluation (Signed)
Physical Therapy Evaluation Patient Details Name: Sylvia Sparks MRN: [id number] DOB: [birth year] Today's Date: 11/26/2020   History of Present Illness  Pt is a 75 year old female admitted for Left TKA on 11/25/20. PMHx significant for R TKA 05/09/18  Clinical Impression  Pt is s/p TKA resulting in the deficits listed below (see PT Problem List). Pt will benefit from skilled PT to increase their independence and safety with mobility to allow discharge to the venue listed below.  Pt requested to use BSC prior to ambulating so assisted to [facility].  Pt then ambulated in hallway and performed LE exercises.  Pt plans to d/c home with her sister and has a few steps to enter home.       Follow Up Recommendations Follow surgeon's recommendation for DC plan and follow-up therapies    Equipment Recommendations  None recommended by PT    Recommendations for Other Services       Precautions / Restrictions Precautions Precautions: Fall;Knee Restrictions Weight Bearing Restrictions: No Other Position/Activity Restrictions: WBAT      Mobility  Bed Mobility Overal bed mobility: Needs Assistance Bed Mobility: Supine to Sit     Supine to sit: Min guard;HOB elevated          Transfers Overall transfer level: Needs assistance Equipment used: Rolling walker (2 wheeled) Transfers: Sit to/from Stand Sit to Stand: Min guard         General transfer comment: verbal cues for UE and LE positioning  Ambulation/Gait Ambulation/Gait assistance: Min guard Gait Distance (Feet): 120 Feet Assistive device: Rolling walker (2 wheeled) Gait Pattern/deviations: Step-to pattern;Decreased stance time - left;Antalgic Gait velocity: decr   General Gait Details: verbal cues for sequence, RW positioning, posture, step length; increased time  Stairs            Wheelchair Mobility    Modified Rankin (Stroke Patients Only)       Balance                                              Pertinent Vitals/Pain Pain Assessment: 0-10 Pain Score: 3  Pain Location: left posterior knee Pain Descriptors / Indicators: Aching;Sore Pain Intervention(s): Repositioned;Monitored during session;Premedicated before session;Ice applied    Home Living Family/patient expects to be discharged to:: Private residence Living Arrangements: Alone Available Help at Discharge: Family;Available 24 hours/day Type of Home: House Home Access: Stairs to enter Entrance Stairs-Rails: None Entrance Stairs-Number of Steps: 3+1 Home Layout: One level Home Equipment: Environmental consultant - 2 wheels;Bedside commode Additional Comments: pt is d/c to her sister's home in [place] as above    Prior Function Level of Independence: Independent with assistive device(s)               Hand Dominance        Extremity/Trunk Assessment        Lower Extremity Assessment Lower Extremity Assessment: LLE deficits/detail LLE Deficits / Details: approx 2-70* AAROM left knee, pt able to perform SLR and ankle pumps       Communication   Communication: No difficulties  Cognition Arousal/Alertness: Awake/alert Behavior During Therapy: WFL for tasks assessed/performed Overall Cognitive Status: Within Functional Limits for tasks assessed  General Comments      Exercises Total Joint Exercises Ankle Circles/Pumps: AROM;Both;10 reps Quad Sets: AROM;Left;10 reps Short Arc Quad: AROM;Left;10 reps Heel Slides: Left;10 reps;AAROM Hip ABduction/ADduction: AROM;Left;10 reps Straight Leg Raises: AROM;Left;10 reps   Assessment/Plan    PT Assessment Patient needs continued PT services  PT Problem List Decreased strength;Decreased range of motion;Decreased mobility;Decreased knowledge of precautions;Pain;Decreased knowledge of use of DME       PT Treatment Interventions Stair training;Gait training;Balance training;Therapeutic exercise;DME  instruction;Therapeutic activities;Patient/family education;Functional mobility training    PT Goals (Current goals can be found in the Care Plan section)  Acute Rehab PT Goals PT Goal Formulation: With patient Time For Goal Achievement: 12/03/20 Potential to Achieve Goals: Good    Frequency 7X/week   Barriers to discharge        Co-evaluation               AM-PAC PT "6 Clicks" Mobility  Outcome Measure Help needed turning from your back to your side while in a flat bed without using bedrails?: A Little Help needed moving from lying on your back to sitting on the side of a flat bed without using bedrails?: A Little Help needed moving to and from a bed to a chair (including a wheelchair)?: A Little Help needed standing up from a chair using your arms (e.g., wheelchair or bedside chair)?: A Little Help needed to walk in hospital room?: A Little Help needed climbing 3-5 steps with a railing? : A Little 6 Click Score: 18    End of Session Equipment Utilized During Treatment: Gait belt Activity Tolerance: Patient tolerated treatment well Patient left: in chair;with call bell/phone within reach;with chair alarm set Nurse Communication: Mobility status PT Visit Diagnosis: Other abnormalities of gait and mobility (R26.89)    Time: [id number] PT Time Calculation (min) (ACUTE ONLY): 27 min   Charges:   PT Evaluation $PT Eval Low Complexity: 1 Low PT Treatments $Therapeutic Exercise: 8-22 mins       Sylvia Sparks PT, DPT Acute Rehabilitation Services Pager: [phone] Office: [phone]   Sylvia Sparks,[name] 11/26/2020, 11:07 AM

## 2020-11-26 NOTE — Progress Notes (Signed)
   Subjective: 1 Day Post-Op Procedure(s) (LRB): TOTAL KNEE ARTHROPLASTY (Left) Patient reports pain as mild.   Patient seen in rounds by Dr. Alvan Dame. Patient is well, and has had no acute complaints or problems. No acute events overnight. Foley catheter removed.  We will start therapy today.   Objective: Vital signs in last 24 hours: Temp:  [97.5 F (36.4 C)-98 F (36.7 C)] 97.9 F (36.6 C) (08/03 0617) Pulse Rate:  [55-71] 57 (08/03 0617) Resp:  [10-20] 16 (08/03 0617) BP: (110-158)/(55-86) 124/67 (08/03 0617) SpO2:  [95 %-100 %] 98 % (08/03 0617) Weight:  [71.2 kg] 71.2 kg (08/02 1115)  Intake/Output from previous day:  Intake/Output Summary (Last 24 hours) at 11/26/2020 0708 Last data filed at 11/26/2020 0600 Gross per 24 hour  Intake 1929.33 ml  Output 3600 ml  Net -1670.67 ml     Intake/Output this shift: No intake/output data recorded.  Labs: Recent Labs    11/26/20 0300  HGB 11.6*   Recent Labs    11/26/20 0300  WBC 6.4  RBC 3.88  HCT 35.9*  PLT 262   Recent Labs    11/26/20 0300  NA 136  K 4.6  CL 109  CO2 21*  BUN 23  CREATININE 1.17*  GLUCOSE 162*  CALCIUM 9.2   No results for input(s): LABPT, INR in the last 72 hours.  Exam: General - Patient is Alert and Oriented Extremity - Neurologically intact Sensation intact distally Intact pulses distally Dorsiflexion/Plantar flexion intact Dressing - dressing C/D/I Motor Function - intact, moving foot and toes well on exam.   Past Medical History:  Diagnosis Date   Anemia    Asthma    Diabetes mellitus without complication (HCC)    Dyspnea    with pneumonia   Hypercholesteremia    Hypertension    OA (osteoarthritis)    Pneumonia     Assessment/Plan: 1 Day Post-Op Procedure(s) (LRB): TOTAL KNEE ARTHROPLASTY (Left) Active Problems:   S/P total knee arthroplasty, left  Estimated body mass index is 25.34 kg/m as calculated from the following:   Height as of this encounter: '5\' 6"'$   (1.676 m).   Weight as of this encounter: 71.2 kg. Advance diet Up with therapy D/C IV fluids   Patient's anticipated LOS is less than 2 midnights, meeting these requirements: - Younger than 64 - Lives within 1 hour of care - Has a competent adult at home to recover with post-op recover - NO history of  - Chronic pain requiring opiods  - Diabetes  - Coronary Artery Disease  - Heart failure  - Heart attack  - Stroke  - DVT/VTE  - Cardiac arrhythmia  - Respiratory Failure/COPD  - Renal failure  - Anemia  - Advanced Liver disease     DVT Prophylaxis - Aspirin Weight bearing as tolerated.  Hgb stable at 11.6 this AM. Cr. Of 1.17 this Am, will d/c celebrex.   Plan is to go Home after hospital stay. Plan for discharge today following 1-2 sessions of PT as long as they are meeting their goals. Patient is scheduled for OPPT. Follow up in the office in 2 weeks.   Griffith Citron, PA-C Orthopedic Surgery [phone] 11/26/2020, 7:08 AM

## 2020-11-26 NOTE — Progress Notes (Signed)
Physical Therapy Treatment Patient Details Name: Sylvia Sparks MRN: [id number] DOB: 29-Oct-1945 Today's Date: 11/26/2020    History of Present Illness Pt is a 75 year old female admitted for Left TKA on 11/25/20. PMHx significant for R TKA 05/09/18    PT Comments    Pt ambulated in hallway and practiced steps and curb with sister holding RW.  Pt reports understanding and feels ready for d/c home today.  Stair and HEP handouts provided.    Follow Up Recommendations  Follow surgeon's recommendation for DC plan and follow-up therapies     Equipment Recommendations  None recommended by PT    Recommendations for Other Services       Precautions / Restrictions Precautions Precautions: Fall;Knee Restrictions Weight Bearing Restrictions: No Other Position/Activity Restrictions: WBAT    Mobility  Bed Mobility Overal bed mobility: Needs Assistance Bed Mobility: Supine to Sit     Supine to sit: Min guard;HOB elevated     General bed mobility comments: pt in recliner    Transfers Overall transfer level: Needs assistance Equipment used: Rolling walker (2 wheeled) Transfers: Sit to/from Stand Sit to Stand: Min guard;Supervision         General transfer comment: verbal cues for UE and LE positioning  Ambulation/Gait Ambulation/Gait assistance: Min guard;Supervision Gait Distance (Feet): 160 Feet Assistive device: Rolling walker (2 wheeled) Gait Pattern/deviations: Step-to pattern;Decreased stance time - left;Antalgic Gait velocity: decr   General Gait Details: verbal cues for sequence, RW positioning, posture, step length; increased time   Stairs Stairs: Yes Stairs assistance: Min guard Stair Management: Step to pattern;Backwards;With walker Number of Stairs: 3 General stair comments: Pt performed 3 shorter steps backwards with RW and also 2 regular height steps with RW backwards; pt's sister held RW for pt with steps; pt also performed one step (curb at home)  forwards with RW; provided handout for stairs backwards with RW and curb (both forwards and backwards with RW); pt and sister report understanding   Wheelchair Mobility    Modified Rankin (Stroke Patients Only)       Balance                                            Cognition Arousal/Alertness: Awake/alert Behavior During Therapy: WFL for tasks assessed/performed Overall Cognitive Status: Within Functional Limits for tasks assessed                                        Exercises     General Comments        Pertinent Vitals/Pain Pain Assessment: 0-10 Pain Score: 3  Pain Location: left posterior knee Pain Descriptors / Indicators: Aching;Sore Pain Intervention(s): Repositioned;Monitored during session;Ice applied    Home Living                      Prior Function            PT Goals (current goals can now be found in the care plan section) Acute Rehab PT Goals PT Goal Formulation: With patient Time For Goal Achievement: 12/03/20 Potential to Achieve Goals: Good Progress towards PT goals: Progressing toward goals    Frequency    7X/week      PT Plan Current plan remains appropriate    Co-evaluation  AM-PAC PT "6 Clicks" Mobility   Outcome Measure  Help needed turning from your back to your side while in a flat bed without using bedrails?: A Little Help needed moving from lying on your back to sitting on the side of a flat bed without using bedrails?: A Little Help needed moving to and from a bed to a chair (including a wheelchair)?: A Little Help needed standing up from a chair using your arms (e.g., wheelchair or bedside chair)?: A Little Help needed to walk in hospital room?: A Little Help needed climbing 3-5 steps with a railing? : A Little 6 Click Score: 18    End of Session Equipment Utilized During Treatment: Gait belt Activity Tolerance: Patient tolerated treatment well Patient  left: in chair;with call bell/phone within reach;with chair alarm set;with family/visitor present Nurse Communication: Mobility status PT Visit Diagnosis: Other abnormalities of gait and mobility (R26.89)     Time: [id number] PT Time Calculation (min) (ACUTE ONLY): 26 min  Charges:  $Gait Training: 23-37 mins          Jannette Spanner PT, DPT Acute Rehabilitation Services Pager: [phone] Office: [phone]    York Ram E 11/26/2020, 2:49 PM

## 2020-11-28 DIAGNOSIS — Z4789 Encounter for other orthopedic aftercare: Secondary | ICD-10-CM | POA: Diagnosis not present

## 2020-11-28 DIAGNOSIS — Z96652 Presence of left artificial knee joint: Secondary | ICD-10-CM | POA: Diagnosis not present

## 2020-11-28 DIAGNOSIS — M25462 Effusion, left knee: Secondary | ICD-10-CM | POA: Diagnosis not present

## 2020-11-28 DIAGNOSIS — R2689 Other abnormalities of gait and mobility: Secondary | ICD-10-CM | POA: Diagnosis not present

## 2020-11-28 DIAGNOSIS — M25562 Pain in left knee: Secondary | ICD-10-CM | POA: Diagnosis not present

## 2020-11-28 DIAGNOSIS — M25662 Stiffness of left knee, not elsewhere classified: Secondary | ICD-10-CM | POA: Diagnosis not present

## 2020-12-01 DIAGNOSIS — Z96652 Presence of left artificial knee joint: Secondary | ICD-10-CM | POA: Diagnosis not present

## 2020-12-01 DIAGNOSIS — M25562 Pain in left knee: Secondary | ICD-10-CM | POA: Diagnosis not present

## 2020-12-01 DIAGNOSIS — M25662 Stiffness of left knee, not elsewhere classified: Secondary | ICD-10-CM | POA: Diagnosis not present

## 2020-12-01 DIAGNOSIS — M25462 Effusion, left knee: Secondary | ICD-10-CM | POA: Diagnosis not present

## 2020-12-01 DIAGNOSIS — Z4789 Encounter for other orthopedic aftercare: Secondary | ICD-10-CM | POA: Diagnosis not present

## 2020-12-01 DIAGNOSIS — R2689 Other abnormalities of gait and mobility: Secondary | ICD-10-CM | POA: Diagnosis not present

## 2020-12-01 DIAGNOSIS — R531 Weakness: Secondary | ICD-10-CM | POA: Diagnosis not present

## 2020-12-03 DIAGNOSIS — Z96652 Presence of left artificial knee joint: Secondary | ICD-10-CM | POA: Diagnosis not present

## 2020-12-03 DIAGNOSIS — M25662 Stiffness of left knee, not elsewhere classified: Secondary | ICD-10-CM | POA: Diagnosis not present

## 2020-12-03 DIAGNOSIS — M25562 Pain in left knee: Secondary | ICD-10-CM | POA: Diagnosis not present

## 2020-12-03 DIAGNOSIS — M25462 Effusion, left knee: Secondary | ICD-10-CM | POA: Diagnosis not present

## 2020-12-03 DIAGNOSIS — Z4789 Encounter for other orthopedic aftercare: Secondary | ICD-10-CM | POA: Diagnosis not present

## 2020-12-03 DIAGNOSIS — R2689 Other abnormalities of gait and mobility: Secondary | ICD-10-CM | POA: Diagnosis not present

## 2020-12-03 DIAGNOSIS — R531 Weakness: Secondary | ICD-10-CM | POA: Diagnosis not present

## 2020-12-04 DIAGNOSIS — M25562 Pain in left knee: Secondary | ICD-10-CM | POA: Diagnosis not present

## 2020-12-04 DIAGNOSIS — R531 Weakness: Secondary | ICD-10-CM | POA: Diagnosis not present

## 2020-12-04 DIAGNOSIS — Z96652 Presence of left artificial knee joint: Secondary | ICD-10-CM | POA: Diagnosis not present

## 2020-12-04 DIAGNOSIS — R2689 Other abnormalities of gait and mobility: Secondary | ICD-10-CM | POA: Diagnosis not present

## 2020-12-04 DIAGNOSIS — M25662 Stiffness of left knee, not elsewhere classified: Secondary | ICD-10-CM | POA: Diagnosis not present

## 2020-12-04 DIAGNOSIS — Z4789 Encounter for other orthopedic aftercare: Secondary | ICD-10-CM | POA: Diagnosis not present

## 2020-12-04 DIAGNOSIS — M25462 Effusion, left knee: Secondary | ICD-10-CM | POA: Diagnosis not present

## 2020-12-04 NOTE — Discharge Summary (Signed)
Physician Discharge Summary   Patient ID: Sylvia Sparks MRN: [id number] DOB/AGE: 1945-11-26 75 y.o.  Admit date: 11/25/2020 Discharge date: 11/26/2020  Primary Diagnosis: Left knee osteoarthritis  Admission Diagnoses:  Past Medical History:  Diagnosis Date   Anemia    Asthma    Diabetes mellitus without complication ([place])    Dyspnea    with pneumonia   Hypercholesteremia    Hypertension    OA (osteoarthritis)    Pneumonia    Discharge Diagnoses:   Active Problems:   S/P total knee arthroplasty, left  Estimated body mass index is 25.34 kg/m as calculated from the following:   Height as of this encounter: '5\' 6"'$  (1.676 m).   Weight as of this encounter: 71.2 kg.  Procedure:  Procedure(s) (LRB): TOTAL KNEE ARTHROPLASTY (Left)   Consults: None  HPI:  Sylvia Sparks is a 75 y.o. female patient of   mine.  The patient had been seen, evaluated, and treated for months conservatively in the   office with medication, activity modification, and injections.  The patient had   radiographic changes of bone-on-bone arthritis with endplate sclerosis and osteophytes noted.  Based on the radiographic changes and failed conservative measures, the patient   decided to proceed with definitive treatment, total knee replacement.  Risks of infection, DVT, component failure, need for revision surgery, neurovascular injury were reviewed in the office setting.  The postop course was reviewed stressing the efforts to maximize post-operative satisfaction and function.  Consent was obtained for benefit of pain   relief.   Laboratory Data: Admission on 11/25/2020, Discharged on 11/26/2020  Component Date Value Ref Range Status   Glucose-Capillary 11/25/2020 125 (A) 70 - 99 mg/dL Final   Glucose reference range applies only to samples taken after fasting for at least 8 hours.   Glucose-Capillary 11/25/2020 148 (A) 70 - 99 mg/dL Final   Glucose reference range applies only to samples taken after  fasting for at least 8 hours.   WBC 11/26/2020 6.4  4.0 - 10.5 K/uL Final   RBC 11/26/2020 3.88  3.87 - 5.11 MIL/uL Final   Hemoglobin 11/26/2020 11.6 (A) 12.0 - 15.0 g/dL Final   HCT 11/26/2020 35.9 (A) 36.0 - 46.0 % Final   MCV 11/26/2020 92.5  80.0 - 100.0 fL Final   MCH 11/26/2020 29.9  26.0 - 34.0 pg Final   MCHC 11/26/2020 32.3  30.0 - 36.0 g/dL Final   RDW 11/26/2020 13.3  11.5 - 15.5 % Final   Platelets 11/26/2020 262  150 - 400 K/uL Final   nRBC 11/26/2020 0.0  0.0 - 0.2 % Final   Performed at [facility], [place] [address] [place], [place] [address]   Sodium 11/26/2020 136  135 - 145 mmol/L Final   Potassium 11/26/2020 4.6  3.5 - 5.1 mmol/L Final   Chloride 11/26/2020 109  98 - 111 mmol/L Final   CO2 11/26/2020 21 (A) 22 - 32 mmol/L Final   Glucose, Bld 11/26/2020 162 (A) 70 - 99 mg/dL Final   Glucose reference range applies only to samples taken after fasting for at least 8 hours.   BUN 11/26/2020 23  8 - 23 mg/dL Final   Creatinine, Ser 11/26/2020 1.17 (A) 0.44 - 1.00 mg/dL Final   Calcium 11/26/2020 9.2  8.9 - 10.3 mg/dL Final   GFR, Estimated 11/26/2020 49 (A) >60 mL/min Final   Comment: (NOTE) Calculated using the CKD-EPI Creatinine Equation (2021)    Anion gap 11/26/2020 6  5 -  15 Final   Performed at [facility], [place] [address] [place], [place] [address]   Glucose-Capillary 11/25/2020 257 (A) 70 - 99 mg/dL Final   Glucose reference range applies only to samples taken after fasting for at least 8 hours.   Glucose-Capillary 11/26/2020 161 (A) 70 - 99 mg/dL Final   Glucose reference range applies only to samples taken after fasting for at least 8 hours.   Glucose-Capillary 11/26/2020 193 (A) 70 - 99 mg/dL Final   Glucose reference range applies only to samples taken after fasting for at least 8 hours.  Hospital Outpatient Visit on 11/21/2020  Component Date Value Ref Range Status   SARS Coronavirus 2 11/21/2020 NEGATIVE  NEGATIVE  Final   Comment: (NOTE) SARS-CoV-2 target nucleic acids are NOT DETECTED.  The SARS-CoV-2 RNA is generally detectable in upper and lower respiratory specimens during the acute phase of infection. Negative results do not preclude SARS-CoV-2 infection, do not rule out co-infections with other pathogens, and should not be used as the sole basis for treatment or other patient management decisions. Negative results must be combined with clinical observations, patient history, and epidemiological information. The expected result is Negative.  Fact Sheet for Patients: SugarRoll.be  Fact Sheet for Healthcare Providers: https://www.woods-mathews.com/  This test is not yet approved or cleared by the [place] FDA and  has been authorized for detection and/or diagnosis of SARS-CoV-2 by FDA under an Emergency Use Authorization (EUA). This EUA will remain  in effect (meaning this test can be used) for the duration of the COVID-19 declaration under Se                          ction 564(b)(1) of the Act, 21 U.S.C. section 360bbb-3(b)(1), unless the authorization is terminated or revoked sooner.  Performed at [facility], [place] [address] [place], [place] [address]   Hospital Outpatient Visit on 11/19/2020  Component Date Value Ref Range Status   MRSA, PCR 11/19/2020 NEGATIVE  NEGATIVE Final   Staphylococcus aureus 11/19/2020 NEGATIVE  NEGATIVE Final   Comment: (NOTE) The Xpert SA Assay (FDA approved for NASAL specimens in patients 81 years of age and older), is one component of a comprehensive surveillance program. It is not intended to diagnose infection nor to guide or monitor treatment. Performed at [facility], [place] [address], [place], [place] [address]    WBC 11/19/2020 4.4  4.0 - 10.5 K/uL Final   RBC 11/19/2020 4.19  3.87 - 5.11 MIL/uL Final   Hemoglobin 11/19/2020 12.5  12.0 - 15.0 g/dL Final   HCT 11/19/2020  39.3  36.0 - 46.0 % Final   MCV 11/19/2020 93.8  80.0 - 100.0 fL Final   MCH 11/19/2020 29.8  26.0 - 34.0 pg Final   MCHC 11/19/2020 31.8  30.0 - 36.0 g/dL Final   RDW 11/19/2020 13.2  11.5 - 15.5 % Final   Platelets 11/19/2020 261  150 - 400 K/uL Final   nRBC 11/19/2020 0.0  0.0 - 0.2 % Final   Performed at [facility], [place] [address] [place], [place] [address]   Sodium 11/19/2020 138  135 - 145 mmol/L Final   Potassium 11/19/2020 3.9  3.5 - 5.1 mmol/L Final   Chloride 11/19/2020 109  98 - 111 mmol/L Final   CO2 11/19/2020 21 (A) 22 - 32 mmol/L Final   Glucose, Bld 11/19/2020 83  70 - 99 mg/dL Final  Glucose reference range applies only to samples taken after fasting for at least 8 hours.   BUN 11/19/2020 26 (A) 8 - 23 mg/dL Final   Creatinine, Ser 11/19/2020 1.21 (A) 0.44 - 1.00 mg/dL Final   Calcium 11/19/2020 9.6  8.9 - 10.3 mg/dL Final   Total Protein 11/19/2020 8.2 (A) 6.5 - 8.1 g/dL Final   Albumin 11/19/2020 4.1  3.5 - 5.0 g/dL Final   AST 11/19/2020 25  15 - 41 U/L Final   ALT 11/19/2020 30  0 - 44 U/L Final   Alkaline Phosphatase 11/19/2020 86  38 - 126 U/L Final   Total Bilirubin 11/19/2020 0.5  0.3 - 1.2 mg/dL Final   GFR, Estimated 11/19/2020 47 (A) >60 mL/min Final   Comment: (NOTE) Calculated using the CKD-EPI Creatinine Equation (2021)    Anion gap 11/19/2020 8  5 - 15 Final   Performed at [facility], [place] [address], [place], [place] [address]   ABO/RH(D) 11/19/2020 O POS   Final   Antibody Screen 11/19/2020 NEG   Final   Sample Expiration 11/19/2020 11/28/2020,2359   Final   Extend sample reason 11/19/2020    Final                   Value:NO TRANSFUSIONS OR PREGNANCY IN THE PAST 3 MONTHS Performed at [place] [address] [place], [place] [address]    Glucose-Capillary 11/19/2020 81  70 - 99 mg/dL Final   Glucose reference range applies only to samples taken after fasting for at least 8 hours.   Hospital Outpatient Visit on 10/21/2020  Component Date Value Ref Range Status   MRSA, PCR 10/21/2020 NEGATIVE  NEGATIVE Final   Staphylococcus aureus 10/21/2020 NEGATIVE  NEGATIVE Final   Comment: (NOTE) The Xpert SA Assay (FDA approved for NASAL specimens in patients 13 years of age and older), is one component of a comprehensive surveillance program. It is not intended to diagnose infection nor to guide or monitor treatment. Performed at [facility], [place] [address], [place], [place] [address]    WBC 10/21/2020 4.0  4.0 - 10.5 K/uL Final   RBC 10/21/2020 4.20  3.87 - 5.11 MIL/uL Final   Hemoglobin 10/21/2020 12.6  12.0 - 15.0 g/dL Final   HCT 10/21/2020 39.2  36.0 - 46.0 % Final   MCV 10/21/2020 93.3  80.0 - 100.0 fL Final   MCH 10/21/2020 30.0  26.0 - 34.0 pg Final   MCHC 10/21/2020 32.1  30.0 - 36.0 g/dL Final   RDW 10/21/2020 13.9  11.5 - 15.5 % Final   Platelets 10/21/2020 299  150 - 400 K/uL Final   nRBC 10/21/2020 0.0  0.0 - 0.2 % Final   Performed at [facility], [place] [address] [place], [place] [address]   Sodium 10/21/2020 139  135 - 145 mmol/L Final   Potassium 10/21/2020 3.6  3.5 - 5.1 mmol/L Final   Chloride 10/21/2020 113 (A) 98 - 111 mmol/L Final   CO2 10/21/2020 21 (A) 22 - 32 mmol/L Final   Glucose, Bld 10/21/2020 65 (A) 70 - 99 mg/dL Final   Glucose reference range applies only to samples taken after fasting for at least 8 hours.   BUN 10/21/2020 23  8 - 23 mg/dL Final   Creatinine, Ser 10/21/2020 1.05 (A) 0.44 - 1.00 mg/dL Final   Calcium 10/21/2020 9.2  8.9 - 10.3 mg/dL Final   Total Protein 10/21/2020 8.2 (A) 6.5 -  8.1 g/dL Final   Albumin 10/21/2020 4.2  3.5 - 5.0 g/dL Final   AST 10/21/2020 335 (A) 15 - 41 U/L Final   ALT 10/21/2020 358 (A) 0 - 44 U/L Final   Alkaline Phosphatase 10/21/2020 199 (A) 38 - 126 U/L Final   Total Bilirubin 10/21/2020 0.5  0.3 - 1.2 mg/dL Final   GFR, Estimated 10/21/2020 56 (A) >60  mL/min Final   Comment: (NOTE) Calculated using the CKD-EPI Creatinine Equation (2021)    Anion gap 10/21/2020 5  5 - 15 Final   Performed at [facility], [place] [address] [place], [place] [address]   ABO/RH(D) 10/21/2020 O POS   Final   Antibody Screen 10/21/2020 NEG   Final   Sample Expiration 10/21/2020 10/31/2020,2359   Final   Extend sample reason 10/21/2020    Final                   Value:NO TRANSFUSIONS OR PREGNANCY IN THE PAST 3 MONTHS Performed at [place] [address] [place], [place] [address]    Hgb A1c MFr Bld 10/21/2020 5.8 (A) 4.8 - 5.6 % Final   Comment: (NOTE)         Prediabetes: 5.7 - 6.4         Diabetes: >6.4         Glycemic control for adults with diabetes: <7.0    Mean Plasma Glucose 10/21/2020 120  mg/dL Final   Comment: (NOTE) Performed At: [facility] [place], [place] [id number] Rush Farmer MD [id number]    Glucose-Capillary 10/21/2020 73  70 - 99 mg/dL Final   Glucose reference range applies only to samples taken after fasting for at least 8 hours.     X-Rays:No results found.  EKG: Orders placed or performed during the hospital encounter of 11/19/20   EKG 12 lead per protocol   EKG 12 lead per protocol     Hospital Course: Sylvia Sparks is a 75 y.o. who was admitted to [facility]. They were brought to the operating room on 11/25/2020 and underwent Procedure(s): TOTAL KNEE ARTHROPLASTY.  Patient tolerated the procedure well and was later transferred to the recovery room and then to the orthopaedic floor for postoperative care. They were given PO and IV analgesics for pain control following their surgery. They were given 24 hours of postoperative antibiotics of  Anti-infectives (From admission, onward)    Start     Dose/Rate Route Frequency Ordered Stop   11/25/20 2000  ceFAZolin (ANCEF) IVPB 2g/100 mL premix        2 g 200 mL/hr over 30 Minutes Intravenous  Every 6 hours 11/25/20 1731 11/26/20 0325   11/25/20 1100  ceFAZolin (ANCEF) IVPB 2g/100 mL premix        2 g 200 mL/hr over 30 Minutes Intravenous On call to O.R. 11/25/20 1045 11/25/20 1339      and started on DVT prophylaxis in the form of Aspirin.   PT and OT were ordered for total joint protocol. Discharge planning consulted to help with postop disposition and equipment needs.  Patient had a good night on the evening of surgery. They started to get up OOB with therapy on POD #1. Pt was seen during rounds and was ready to go home pending progress with therapy.She worked with therapy on POD #1 and was meeting her goals. Pt was discharged to home later that day in stable condition.  Diet: Regular diet Activity:  WBAT Follow-up: in 2 weeks Disposition: Home Discharged Condition: good   Discharge Instructions     Call MD / Call 911   Complete by: As directed    If you experience chest pain or shortness of breath, CALL 911 and be transported to the hospital emergency room.  If you develope a fever above 101 F, pus (white drainage) or increased drainage or redness at the wound, or calf pain, call your surgeon's office.   Change dressing   Complete by: As directed    Maintain surgical dressing until follow up in the clinic. If the edges start to pull up, may reinforce with tape. If the dressing is no longer working, may remove and cover with gauze and tape, but must keep the area dry and clean.  Call with any questions or concerns.   Constipation Prevention   Complete by: As directed    Drink plenty of fluids.  Prune juice may be helpful.  You may use a stool softener, such as Colace (over the counter) 100 mg twice a day.  Use MiraLax (over the counter) for constipation as needed.   Diet - low sodium heart healthy   Complete by: As directed    Increase activity slowly as tolerated   Complete by: As directed    Weight bearing as tolerated with assist device (walker, cane, etc) as directed,  use it as long as suggested by your surgeon or therapist, typically at least 4-6 weeks.   Post-operative opioid taper instructions:   Complete by: As directed    POST-OPERATIVE OPIOID TAPER INSTRUCTIONS: It is important to wean off of your opioid medication as soon as possible. If you do not need pain medication after your surgery it is ok to stop day one. Opioids include: Codeine, Hydrocodone(Norco, Vicodin), Oxycodone(Percocet, oxycontin) and hydromorphone amongst others.  Long term and even short term use of opiods can cause: Increased pain response Dependence Constipation Depression Respiratory depression And more.  Withdrawal symptoms can include Flu like symptoms Nausea, vomiting And more Techniques to manage these symptoms Hydrate well Eat regular healthy meals Stay active Use relaxation techniques(deep breathing, meditating, yoga) Do Not substitute Alcohol to help with tapering If you have been on opioids for less than two weeks and do not have pain than it is ok to stop all together.  Plan to wean off of opioids This plan should start within one week post op of your joint replacement. Maintain the same interval or time between taking each dose and first decrease the dose.  Cut the total daily intake of opioids by one tablet each day Next start to increase the time between doses. The last dose that should be eliminated is the evening dose.      TED hose   Complete by: As directed    Use stockings (TED hose) for 2 weeks on both leg(s).  You may remove them at night for sleeping.      Allergies as of 11/26/2020   No Known Allergies      Medication List     STOP taking these medications    aspirin EC 81 MG tablet Replaced by: aspirin 81 MG chewable tablet   Krill Oil 1000 MG Caps       TAKE these medications    amLODipine 10 MG tablet Commonly known as: NORVASC Take 10 mg by mouth daily.   aspirin 81 MG chewable tablet Chew 1 tablet (81 mg total) by  mouth 2 (two) times daily for 28 days.  Replaces: aspirin EC 81 MG tablet   CORICIDIN HBP PO Take 2 capsules by mouth every 6 (six) hours as needed (cold.cough).   diclofenac Sodium 1 % Gel Commonly known as: VOLTAREN Apply 1 application topically daily as needed (pain).   docusate sodium 100 MG capsule Commonly known as: COLACE Take 1 capsule (100 mg total) by mouth 2 (two) times daily.   Ferrous Sulfate 142 (45 Fe) MG Tbcr Take 142 mg by mouth daily.   glipiZIDE 5 MG tablet Commonly known as: GLUCOTROL Take 2.5 mg by mouth daily.   HYDROcodone-acetaminophen 5-325 MG tablet Commonly known as: NORCO/VICODIN Take 1-2 tablets by mouth every 4 (four) hours as needed for severe pain.   lovastatin 10 MG tablet Commonly known as: MEVACOR Take 10 mg by mouth at bedtime.   methocarbamol 500 MG tablet Commonly known as: ROBAXIN Take 1 tablet (500 mg total) by mouth every 6 (six) hours as needed for muscle spasms.   multivitamins ther. w/minerals Tabs tablet Take 2 tablets by mouth daily.   polyethylene glycol 17 g packet Commonly known as: MIRALAX / GLYCOLAX Take 17 g by mouth daily as needed for mild constipation.   potassium chloride 10 MEQ tablet Commonly known as: KLOR-CON Take 10 mEq by mouth 2 (two) times daily.   SALONPAS PAIN RELIEF PATCH EX Apply 1 application topically daily as needed (left knee).   valsartan 160 MG tablet Commonly known as: DIOVAN Take 160 mg by mouth daily.   vitamin C 1000 MG tablet Take 1,000 mg by mouth daily.   Vitamin D 50 MCG (2000 UT) tablet Take 2,000 Units by mouth daily.               Discharge Care Instructions  (From admission, onward)           Start     Ordered   11/26/20 0000  Change dressing       Comments: Maintain surgical dressing until follow up in the clinic. If the edges start to pull up, may reinforce with tape. If the dressing is no longer working, may remove and cover with gauze and tape, but must  keep the area dry and clean.  Call with any questions or concerns.   11/26/20 [id number]            Follow-up Information     Paralee Cancel, MD. Go on 12/10/2020.   Specialty: Orthopedic Surgery Why: You are scheduled for first post op appointment on Wednesday August 17th at 2:15pm. Contact information: [address] [place] [address] [place] [address] [id number]                 Signed: Griffith Citron, PA-C Orthopedic Surgery 12/04/2020, 9:03 AM

## 2020-12-08 DIAGNOSIS — M25662 Stiffness of left knee, not elsewhere classified: Secondary | ICD-10-CM | POA: Diagnosis not present

## 2020-12-08 DIAGNOSIS — Z4789 Encounter for other orthopedic aftercare: Secondary | ICD-10-CM | POA: Diagnosis not present

## 2020-12-08 DIAGNOSIS — M25562 Pain in left knee: Secondary | ICD-10-CM | POA: Diagnosis not present

## 2020-12-08 DIAGNOSIS — R531 Weakness: Secondary | ICD-10-CM | POA: Diagnosis not present

## 2020-12-08 DIAGNOSIS — M25462 Effusion, left knee: Secondary | ICD-10-CM | POA: Diagnosis not present

## 2020-12-08 DIAGNOSIS — Z96652 Presence of left artificial knee joint: Secondary | ICD-10-CM | POA: Diagnosis not present

## 2020-12-08 DIAGNOSIS — R2689 Other abnormalities of gait and mobility: Secondary | ICD-10-CM | POA: Diagnosis not present

## 2020-12-10 DIAGNOSIS — M25562 Pain in left knee: Secondary | ICD-10-CM | POA: Diagnosis not present

## 2020-12-10 DIAGNOSIS — R2689 Other abnormalities of gait and mobility: Secondary | ICD-10-CM | POA: Diagnosis not present

## 2020-12-10 DIAGNOSIS — Z96652 Presence of left artificial knee joint: Secondary | ICD-10-CM | POA: Diagnosis not present

## 2020-12-10 DIAGNOSIS — Z4789 Encounter for other orthopedic aftercare: Secondary | ICD-10-CM | POA: Diagnosis not present

## 2020-12-10 DIAGNOSIS — M25662 Stiffness of left knee, not elsewhere classified: Secondary | ICD-10-CM | POA: Diagnosis not present

## 2020-12-10 DIAGNOSIS — R531 Weakness: Secondary | ICD-10-CM | POA: Diagnosis not present

## 2020-12-10 DIAGNOSIS — M25462 Effusion, left knee: Secondary | ICD-10-CM | POA: Diagnosis not present

## 2020-12-12 DIAGNOSIS — R531 Weakness: Secondary | ICD-10-CM | POA: Diagnosis not present

## 2020-12-12 DIAGNOSIS — Z96652 Presence of left artificial knee joint: Secondary | ICD-10-CM | POA: Diagnosis not present

## 2020-12-12 DIAGNOSIS — R2689 Other abnormalities of gait and mobility: Secondary | ICD-10-CM | POA: Diagnosis not present

## 2020-12-12 DIAGNOSIS — M25462 Effusion, left knee: Secondary | ICD-10-CM | POA: Diagnosis not present

## 2020-12-12 DIAGNOSIS — M25562 Pain in left knee: Secondary | ICD-10-CM | POA: Diagnosis not present

## 2020-12-12 DIAGNOSIS — M25662 Stiffness of left knee, not elsewhere classified: Secondary | ICD-10-CM | POA: Diagnosis not present

## 2020-12-12 DIAGNOSIS — Z4789 Encounter for other orthopedic aftercare: Secondary | ICD-10-CM | POA: Diagnosis not present

## 2020-12-15 DIAGNOSIS — Z96652 Presence of left artificial knee joint: Secondary | ICD-10-CM | POA: Diagnosis not present

## 2020-12-15 DIAGNOSIS — Z4789 Encounter for other orthopedic aftercare: Secondary | ICD-10-CM | POA: Diagnosis not present

## 2020-12-15 DIAGNOSIS — M25562 Pain in left knee: Secondary | ICD-10-CM | POA: Diagnosis not present

## 2020-12-15 DIAGNOSIS — R2689 Other abnormalities of gait and mobility: Secondary | ICD-10-CM | POA: Diagnosis not present

## 2020-12-15 DIAGNOSIS — M25462 Effusion, left knee: Secondary | ICD-10-CM | POA: Diagnosis not present

## 2020-12-15 DIAGNOSIS — R531 Weakness: Secondary | ICD-10-CM | POA: Diagnosis not present

## 2020-12-15 DIAGNOSIS — M25662 Stiffness of left knee, not elsewhere classified: Secondary | ICD-10-CM | POA: Diagnosis not present

## 2020-12-17 DIAGNOSIS — M25562 Pain in left knee: Secondary | ICD-10-CM | POA: Diagnosis not present

## 2020-12-17 DIAGNOSIS — Z96652 Presence of left artificial knee joint: Secondary | ICD-10-CM | POA: Diagnosis not present

## 2020-12-17 DIAGNOSIS — M25462 Effusion, left knee: Secondary | ICD-10-CM | POA: Diagnosis not present

## 2020-12-17 DIAGNOSIS — R531 Weakness: Secondary | ICD-10-CM | POA: Diagnosis not present

## 2020-12-17 DIAGNOSIS — R2689 Other abnormalities of gait and mobility: Secondary | ICD-10-CM | POA: Diagnosis not present

## 2020-12-17 DIAGNOSIS — Z4789 Encounter for other orthopedic aftercare: Secondary | ICD-10-CM | POA: Diagnosis not present

## 2020-12-17 DIAGNOSIS — M25662 Stiffness of left knee, not elsewhere classified: Secondary | ICD-10-CM | POA: Diagnosis not present

## 2020-12-19 DIAGNOSIS — M25562 Pain in left knee: Secondary | ICD-10-CM | POA: Diagnosis not present

## 2020-12-19 DIAGNOSIS — R531 Weakness: Secondary | ICD-10-CM | POA: Diagnosis not present

## 2020-12-19 DIAGNOSIS — M25462 Effusion, left knee: Secondary | ICD-10-CM | POA: Diagnosis not present

## 2020-12-19 DIAGNOSIS — Z4789 Encounter for other orthopedic aftercare: Secondary | ICD-10-CM | POA: Diagnosis not present

## 2020-12-19 DIAGNOSIS — R2689 Other abnormalities of gait and mobility: Secondary | ICD-10-CM | POA: Diagnosis not present

## 2020-12-19 DIAGNOSIS — M25662 Stiffness of left knee, not elsewhere classified: Secondary | ICD-10-CM | POA: Diagnosis not present

## 2020-12-19 DIAGNOSIS — Z96652 Presence of left artificial knee joint: Secondary | ICD-10-CM | POA: Diagnosis not present

## 2020-12-23 DIAGNOSIS — M25662 Stiffness of left knee, not elsewhere classified: Secondary | ICD-10-CM | POA: Diagnosis not present

## 2020-12-23 DIAGNOSIS — Z4789 Encounter for other orthopedic aftercare: Secondary | ICD-10-CM | POA: Diagnosis not present

## 2020-12-23 DIAGNOSIS — M25562 Pain in left knee: Secondary | ICD-10-CM | POA: Diagnosis not present

## 2020-12-23 DIAGNOSIS — R2689 Other abnormalities of gait and mobility: Secondary | ICD-10-CM | POA: Diagnosis not present

## 2020-12-23 DIAGNOSIS — R531 Weakness: Secondary | ICD-10-CM | POA: Diagnosis not present

## 2020-12-23 DIAGNOSIS — M25462 Effusion, left knee: Secondary | ICD-10-CM | POA: Diagnosis not present

## 2020-12-23 DIAGNOSIS — Z96652 Presence of left artificial knee joint: Secondary | ICD-10-CM | POA: Diagnosis not present

## 2020-12-24 DIAGNOSIS — M25562 Pain in left knee: Secondary | ICD-10-CM | POA: Diagnosis not present

## 2020-12-24 DIAGNOSIS — R2689 Other abnormalities of gait and mobility: Secondary | ICD-10-CM | POA: Diagnosis not present

## 2020-12-24 DIAGNOSIS — R531 Weakness: Secondary | ICD-10-CM | POA: Diagnosis not present

## 2020-12-24 DIAGNOSIS — M25462 Effusion, left knee: Secondary | ICD-10-CM | POA: Diagnosis not present

## 2020-12-24 DIAGNOSIS — Z96652 Presence of left artificial knee joint: Secondary | ICD-10-CM | POA: Diagnosis not present

## 2020-12-24 DIAGNOSIS — Z4789 Encounter for other orthopedic aftercare: Secondary | ICD-10-CM | POA: Diagnosis not present

## 2020-12-24 DIAGNOSIS — M25662 Stiffness of left knee, not elsewhere classified: Secondary | ICD-10-CM | POA: Diagnosis not present

## 2020-12-26 DIAGNOSIS — Z4789 Encounter for other orthopedic aftercare: Secondary | ICD-10-CM | POA: Diagnosis not present

## 2020-12-26 DIAGNOSIS — Z96652 Presence of left artificial knee joint: Secondary | ICD-10-CM | POA: Diagnosis not present

## 2020-12-26 DIAGNOSIS — R2689 Other abnormalities of gait and mobility: Secondary | ICD-10-CM | POA: Diagnosis not present

## 2020-12-26 DIAGNOSIS — M25462 Effusion, left knee: Secondary | ICD-10-CM | POA: Diagnosis not present

## 2020-12-26 DIAGNOSIS — M25662 Stiffness of left knee, not elsewhere classified: Secondary | ICD-10-CM | POA: Diagnosis not present

## 2020-12-26 DIAGNOSIS — M25562 Pain in left knee: Secondary | ICD-10-CM | POA: Diagnosis not present

## 2020-12-26 DIAGNOSIS — R531 Weakness: Secondary | ICD-10-CM | POA: Diagnosis not present

## 2020-12-30 DIAGNOSIS — M25562 Pain in left knee: Secondary | ICD-10-CM | POA: Diagnosis not present

## 2020-12-30 DIAGNOSIS — Z4789 Encounter for other orthopedic aftercare: Secondary | ICD-10-CM | POA: Diagnosis not present

## 2020-12-30 DIAGNOSIS — Z96652 Presence of left artificial knee joint: Secondary | ICD-10-CM | POA: Diagnosis not present

## 2020-12-30 DIAGNOSIS — R2689 Other abnormalities of gait and mobility: Secondary | ICD-10-CM | POA: Diagnosis not present

## 2020-12-30 DIAGNOSIS — R531 Weakness: Secondary | ICD-10-CM | POA: Diagnosis not present

## 2020-12-30 DIAGNOSIS — M25462 Effusion, left knee: Secondary | ICD-10-CM | POA: Diagnosis not present

## 2020-12-30 DIAGNOSIS — M25662 Stiffness of left knee, not elsewhere classified: Secondary | ICD-10-CM | POA: Diagnosis not present

## 2021-01-02 DIAGNOSIS — M25562 Pain in left knee: Secondary | ICD-10-CM | POA: Diagnosis not present

## 2021-01-02 DIAGNOSIS — Z96652 Presence of left artificial knee joint: Secondary | ICD-10-CM | POA: Diagnosis not present

## 2021-01-02 DIAGNOSIS — M25462 Effusion, left knee: Secondary | ICD-10-CM | POA: Diagnosis not present

## 2021-01-02 DIAGNOSIS — R2689 Other abnormalities of gait and mobility: Secondary | ICD-10-CM | POA: Diagnosis not present

## 2021-01-02 DIAGNOSIS — M25662 Stiffness of left knee, not elsewhere classified: Secondary | ICD-10-CM | POA: Diagnosis not present

## 2021-01-02 DIAGNOSIS — Z4789 Encounter for other orthopedic aftercare: Secondary | ICD-10-CM | POA: Diagnosis not present

## 2021-01-02 DIAGNOSIS — R531 Weakness: Secondary | ICD-10-CM | POA: Diagnosis not present

## 2021-01-06 DIAGNOSIS — M25662 Stiffness of left knee, not elsewhere classified: Secondary | ICD-10-CM | POA: Diagnosis not present

## 2021-01-06 DIAGNOSIS — Z96652 Presence of left artificial knee joint: Secondary | ICD-10-CM | POA: Diagnosis not present

## 2021-01-06 DIAGNOSIS — R531 Weakness: Secondary | ICD-10-CM | POA: Diagnosis not present

## 2021-01-06 DIAGNOSIS — M25562 Pain in left knee: Secondary | ICD-10-CM | POA: Diagnosis not present

## 2021-01-06 DIAGNOSIS — R2689 Other abnormalities of gait and mobility: Secondary | ICD-10-CM | POA: Diagnosis not present

## 2021-01-06 DIAGNOSIS — M25462 Effusion, left knee: Secondary | ICD-10-CM | POA: Diagnosis not present

## 2021-01-06 DIAGNOSIS — Z4789 Encounter for other orthopedic aftercare: Secondary | ICD-10-CM | POA: Diagnosis not present

## 2021-01-07 DIAGNOSIS — Z471 Aftercare following joint replacement surgery: Secondary | ICD-10-CM | POA: Diagnosis not present

## 2021-01-07 DIAGNOSIS — Z96652 Presence of left artificial knee joint: Secondary | ICD-10-CM | POA: Diagnosis not present

## 2021-01-08 DIAGNOSIS — Z4789 Encounter for other orthopedic aftercare: Secondary | ICD-10-CM | POA: Diagnosis not present

## 2021-01-08 DIAGNOSIS — Z96652 Presence of left artificial knee joint: Secondary | ICD-10-CM | POA: Diagnosis not present

## 2021-01-08 DIAGNOSIS — R2689 Other abnormalities of gait and mobility: Secondary | ICD-10-CM | POA: Diagnosis not present

## 2021-01-08 DIAGNOSIS — M25562 Pain in left knee: Secondary | ICD-10-CM | POA: Diagnosis not present

## 2021-01-08 DIAGNOSIS — R531 Weakness: Secondary | ICD-10-CM | POA: Diagnosis not present

## 2021-01-08 DIAGNOSIS — M25462 Effusion, left knee: Secondary | ICD-10-CM | POA: Diagnosis not present

## 2021-01-08 DIAGNOSIS — M25662 Stiffness of left knee, not elsewhere classified: Secondary | ICD-10-CM | POA: Diagnosis not present

## 2021-01-14 DIAGNOSIS — Z4789 Encounter for other orthopedic aftercare: Secondary | ICD-10-CM | POA: Diagnosis not present

## 2021-01-14 DIAGNOSIS — M25462 Effusion, left knee: Secondary | ICD-10-CM | POA: Diagnosis not present

## 2021-01-14 DIAGNOSIS — R531 Weakness: Secondary | ICD-10-CM | POA: Diagnosis not present

## 2021-01-14 DIAGNOSIS — M25662 Stiffness of left knee, not elsewhere classified: Secondary | ICD-10-CM | POA: Diagnosis not present

## 2021-01-14 DIAGNOSIS — Z96652 Presence of left artificial knee joint: Secondary | ICD-10-CM | POA: Diagnosis not present

## 2021-01-14 DIAGNOSIS — M25562 Pain in left knee: Secondary | ICD-10-CM | POA: Diagnosis not present

## 2021-01-14 DIAGNOSIS — R2689 Other abnormalities of gait and mobility: Secondary | ICD-10-CM | POA: Diagnosis not present

## 2021-02-04 DIAGNOSIS — Z23 Encounter for immunization: Secondary | ICD-10-CM | POA: Diagnosis not present

## 2021-03-02 DIAGNOSIS — E782 Mixed hyperlipidemia: Secondary | ICD-10-CM | POA: Diagnosis not present

## 2021-03-02 DIAGNOSIS — I10 Essential (primary) hypertension: Secondary | ICD-10-CM | POA: Diagnosis not present

## 2021-03-02 DIAGNOSIS — E1122 Type 2 diabetes mellitus with diabetic chronic kidney disease: Secondary | ICD-10-CM | POA: Diagnosis not present

## 2021-03-10 DIAGNOSIS — K5900 Constipation, unspecified: Secondary | ICD-10-CM | POA: Diagnosis not present

## 2021-03-10 DIAGNOSIS — E1122 Type 2 diabetes mellitus with diabetic chronic kidney disease: Secondary | ICD-10-CM | POA: Diagnosis not present

## 2021-03-10 DIAGNOSIS — E876 Hypokalemia: Secondary | ICD-10-CM | POA: Diagnosis not present

## 2021-03-10 DIAGNOSIS — Z23 Encounter for immunization: Secondary | ICD-10-CM | POA: Diagnosis not present

## 2021-03-10 DIAGNOSIS — I10 Essential (primary) hypertension: Secondary | ICD-10-CM | POA: Diagnosis not present

## 2021-03-10 DIAGNOSIS — M170 Bilateral primary osteoarthritis of knee: Secondary | ICD-10-CM | POA: Diagnosis not present

## 2021-03-10 DIAGNOSIS — E782 Mixed hyperlipidemia: Secondary | ICD-10-CM | POA: Diagnosis not present

## 2021-04-06 DIAGNOSIS — E119 Type 2 diabetes mellitus without complications: Secondary | ICD-10-CM | POA: Diagnosis not present

## 2021-04-06 DIAGNOSIS — H2513 Age-related nuclear cataract, bilateral: Secondary | ICD-10-CM | POA: Diagnosis not present

## 2021-04-06 DIAGNOSIS — H25013 Cortical age-related cataract, bilateral: Secondary | ICD-10-CM | POA: Diagnosis not present

## 2021-04-06 DIAGNOSIS — H35363 Drusen (degenerative) of macula, bilateral: Secondary | ICD-10-CM | POA: Diagnosis not present

## 2021-09-02 DIAGNOSIS — I10 Essential (primary) hypertension: Secondary | ICD-10-CM | POA: Diagnosis not present

## 2021-09-02 DIAGNOSIS — R7301 Impaired fasting glucose: Secondary | ICD-10-CM | POA: Diagnosis not present

## 2021-09-08 DIAGNOSIS — K5900 Constipation, unspecified: Secondary | ICD-10-CM | POA: Diagnosis not present

## 2021-09-08 DIAGNOSIS — E876 Hypokalemia: Secondary | ICD-10-CM | POA: Diagnosis not present

## 2021-09-08 DIAGNOSIS — B3731 Acute candidiasis of vulva and vagina: Secondary | ICD-10-CM | POA: Diagnosis not present

## 2021-09-08 DIAGNOSIS — E782 Mixed hyperlipidemia: Secondary | ICD-10-CM | POA: Diagnosis not present

## 2021-09-08 DIAGNOSIS — Z6828 Body mass index (BMI) 28.0-28.9, adult: Secondary | ICD-10-CM | POA: Diagnosis not present

## 2021-09-08 DIAGNOSIS — E663 Overweight: Secondary | ICD-10-CM | POA: Diagnosis not present

## 2021-09-08 DIAGNOSIS — I10 Essential (primary) hypertension: Secondary | ICD-10-CM | POA: Diagnosis not present

## 2021-09-08 DIAGNOSIS — M170 Bilateral primary osteoarthritis of knee: Secondary | ICD-10-CM | POA: Diagnosis not present

## 2021-09-08 DIAGNOSIS — E1122 Type 2 diabetes mellitus with diabetic chronic kidney disease: Secondary | ICD-10-CM | POA: Diagnosis not present

## 2021-09-11 ENCOUNTER — Other Ambulatory Visit (HOSPITAL_COMMUNITY): Payer: Self-pay | Admitting: Nurse Practitioner

## 2021-09-11 ENCOUNTER — Other Ambulatory Visit (HOSPITAL_COMMUNITY): Payer: Self-pay | Admitting: Internal Medicine

## 2021-09-11 DIAGNOSIS — Z1231 Encounter for screening mammogram for malignant neoplasm of breast: Secondary | ICD-10-CM

## 2021-09-16 ENCOUNTER — Ambulatory Visit (HOSPITAL_COMMUNITY)
Admission: RE | Admit: 2021-09-16 | Discharge: 2021-09-16 | Disposition: A | Discharge status: Home / Self Care | Payer: Medicare Other | Source: Ambulatory Visit | Attending: Nurse Practitioner | Admitting: Nurse Practitioner

## 2021-09-16 DIAGNOSIS — Z1231 Encounter for screening mammogram for malignant neoplasm of breast: Secondary | ICD-10-CM | POA: Insufficient documentation

## 2022-01-18 DIAGNOSIS — Z96653 Presence of artificial knee joint, bilateral: Secondary | ICD-10-CM | POA: Diagnosis not present

## 2022-01-18 DIAGNOSIS — Z96652 Presence of left artificial knee joint: Secondary | ICD-10-CM | POA: Diagnosis not present

## 2022-01-30 DIAGNOSIS — Z23 Encounter for immunization: Secondary | ICD-10-CM | POA: Diagnosis not present

## 2022-02-02 DIAGNOSIS — M79672 Pain in left foot: Secondary | ICD-10-CM | POA: Diagnosis not present

## 2022-03-03 DIAGNOSIS — I10 Essential (primary) hypertension: Secondary | ICD-10-CM | POA: Diagnosis not present

## 2022-03-03 DIAGNOSIS — R7301 Impaired fasting glucose: Secondary | ICD-10-CM | POA: Diagnosis not present

## 2022-03-10 DIAGNOSIS — E1122 Type 2 diabetes mellitus with diabetic chronic kidney disease: Secondary | ICD-10-CM | POA: Diagnosis not present

## 2022-03-10 DIAGNOSIS — E782 Mixed hyperlipidemia: Secondary | ICD-10-CM | POA: Diagnosis not present

## 2022-03-10 DIAGNOSIS — I10 Essential (primary) hypertension: Secondary | ICD-10-CM | POA: Diagnosis not present

## 2022-03-10 DIAGNOSIS — B3731 Acute candidiasis of vulva and vagina: Secondary | ICD-10-CM | POA: Diagnosis not present

## 2022-03-10 DIAGNOSIS — E870 Hyperosmolality and hypernatremia: Secondary | ICD-10-CM | POA: Diagnosis not present

## 2022-03-10 DIAGNOSIS — E876 Hypokalemia: Secondary | ICD-10-CM | POA: Diagnosis not present

## 2022-03-10 DIAGNOSIS — K5900 Constipation, unspecified: Secondary | ICD-10-CM | POA: Diagnosis not present

## 2022-03-10 DIAGNOSIS — Z0000 Encounter for general adult medical examination without abnormal findings: Secondary | ICD-10-CM | POA: Diagnosis not present

## 2022-03-10 DIAGNOSIS — M170 Bilateral primary osteoarthritis of knee: Secondary | ICD-10-CM | POA: Diagnosis not present

## 2022-04-06 DIAGNOSIS — H2513 Age-related nuclear cataract, bilateral: Secondary | ICD-10-CM | POA: Diagnosis not present

## 2022-04-06 DIAGNOSIS — H02831 Dermatochalasis of right upper eyelid: Secondary | ICD-10-CM | POA: Diagnosis not present

## 2022-04-06 DIAGNOSIS — H43812 Vitreous degeneration, left eye: Secondary | ICD-10-CM | POA: Diagnosis not present

## 2022-04-06 DIAGNOSIS — E119 Type 2 diabetes mellitus without complications: Secondary | ICD-10-CM | POA: Diagnosis not present

## 2022-04-29 DIAGNOSIS — Z23 Encounter for immunization: Secondary | ICD-10-CM | POA: Diagnosis not present

## 2022-06-30 IMAGING — MG MM DIGITAL SCREENING BILAT W/ TOMO AND CAD
6 of 10 series · 6 of 30 positions shown · non-contrast
Comparison: Previous exam(s).

CLINICAL DATA: Screening.

EXAM:
DIGITAL SCREENING BILATERAL MAMMOGRAM WITH TOMOSYNTHESIS AND CAD
TECHNIQUE: Bilateral screening digital craniocaudal and mediolateral oblique
mammograms were obtained. Bilateral screening digital breast
tomosynthesis was performed. The images were evaluated with
computer-aided detection.

[R MLO synth-2D]
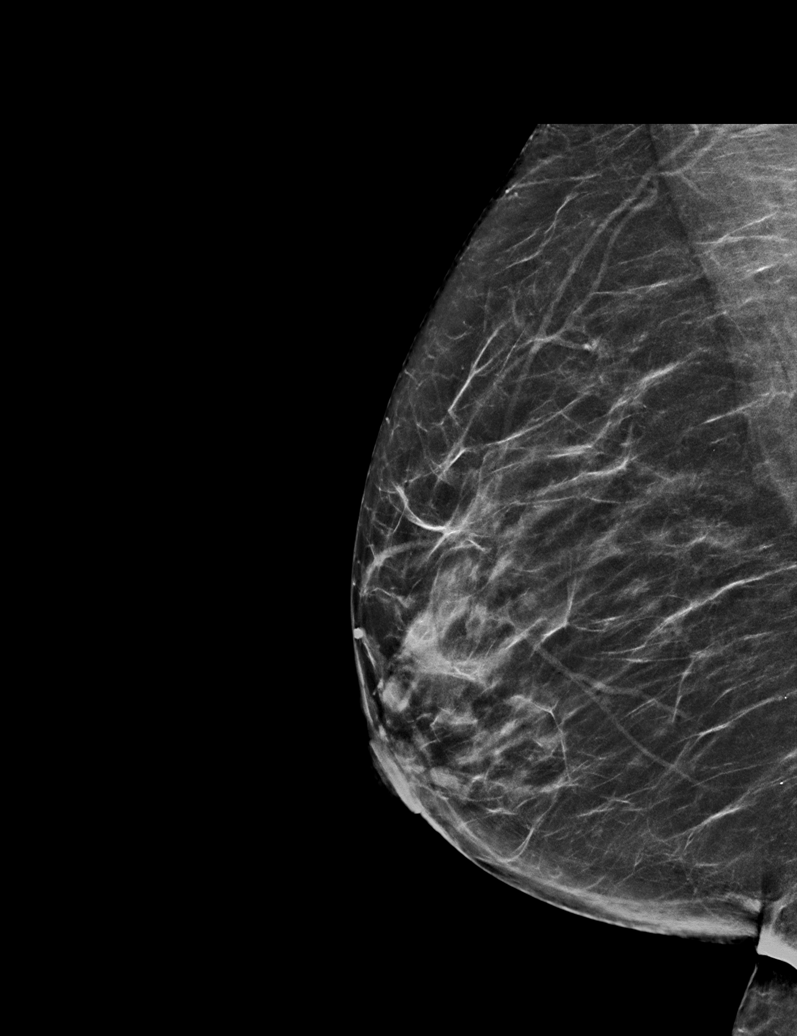

[R CC synth-2D]
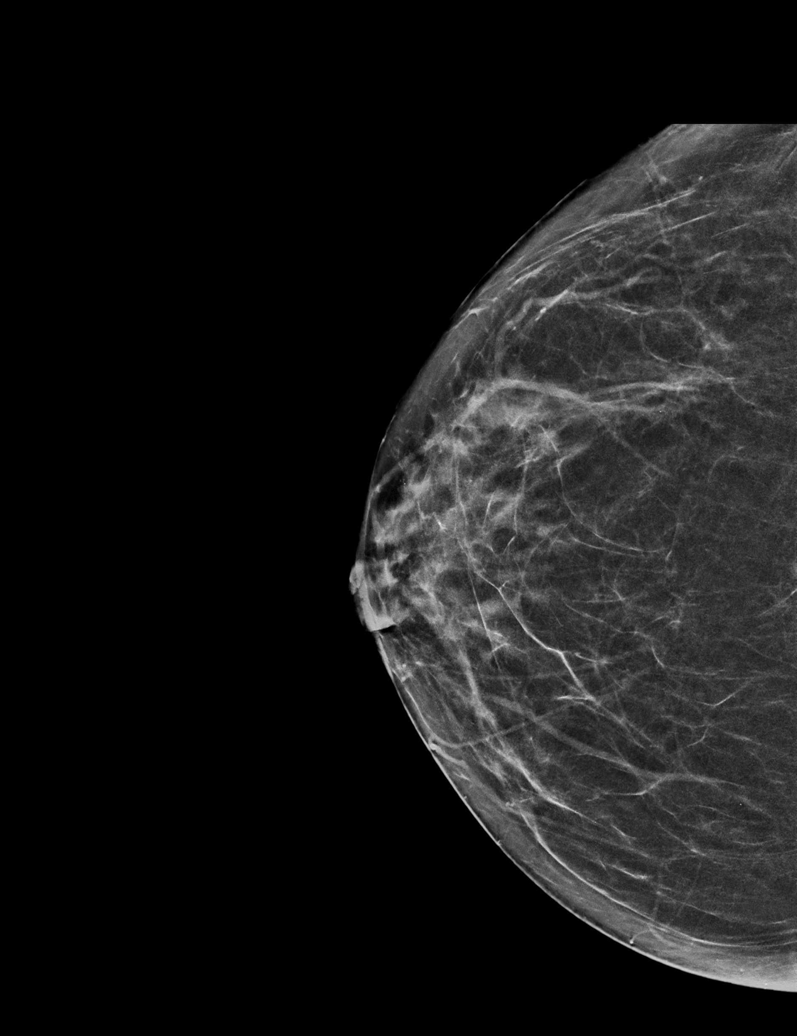

[L MLO synth-2D (1 of 2)]
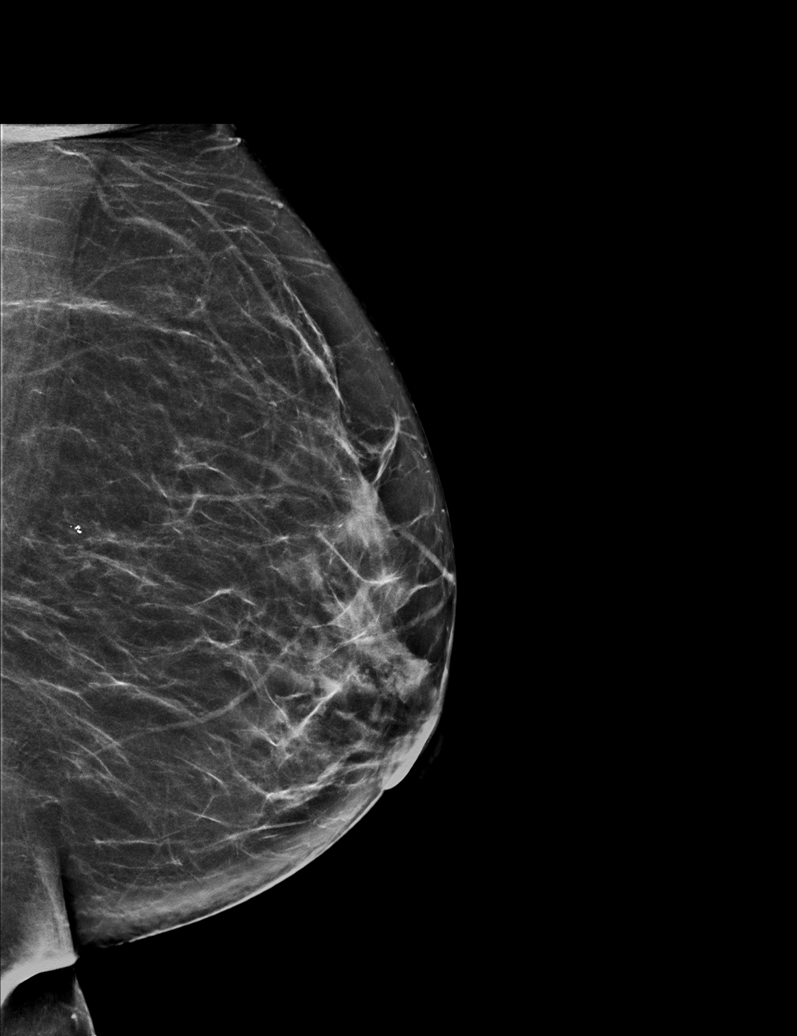

[L MLO synth-2D (2 of 2)]
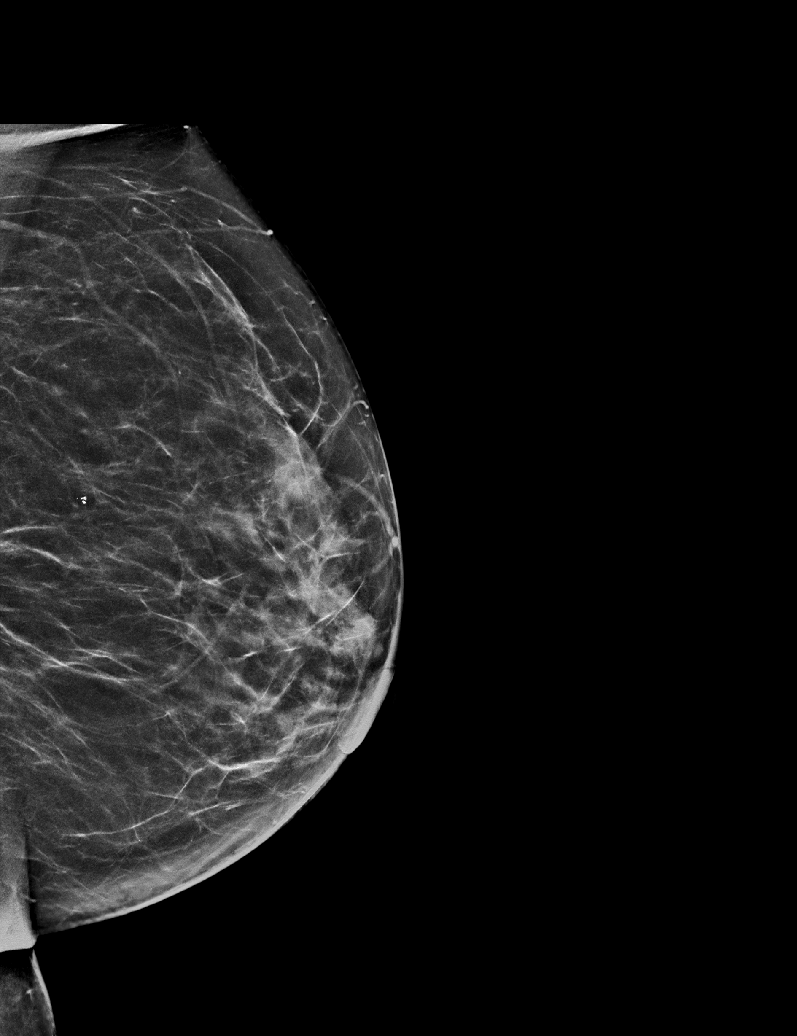

[L CC synth-2D]
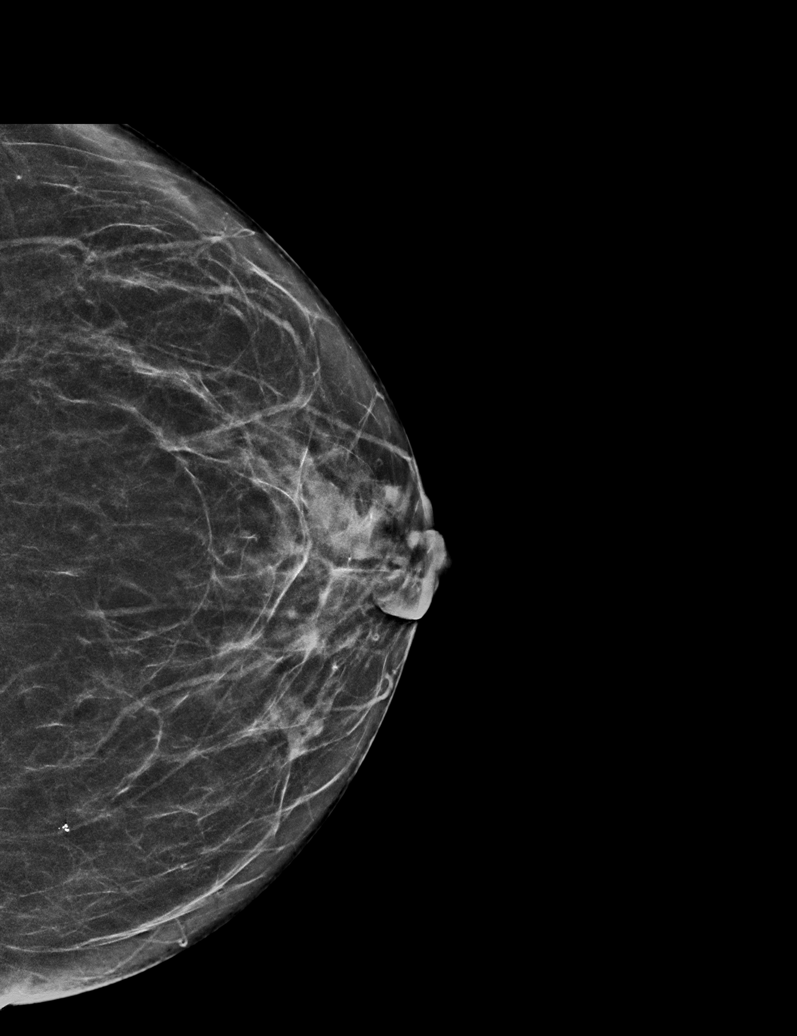

[L MLO tomo · tomo slice 31/60.0]
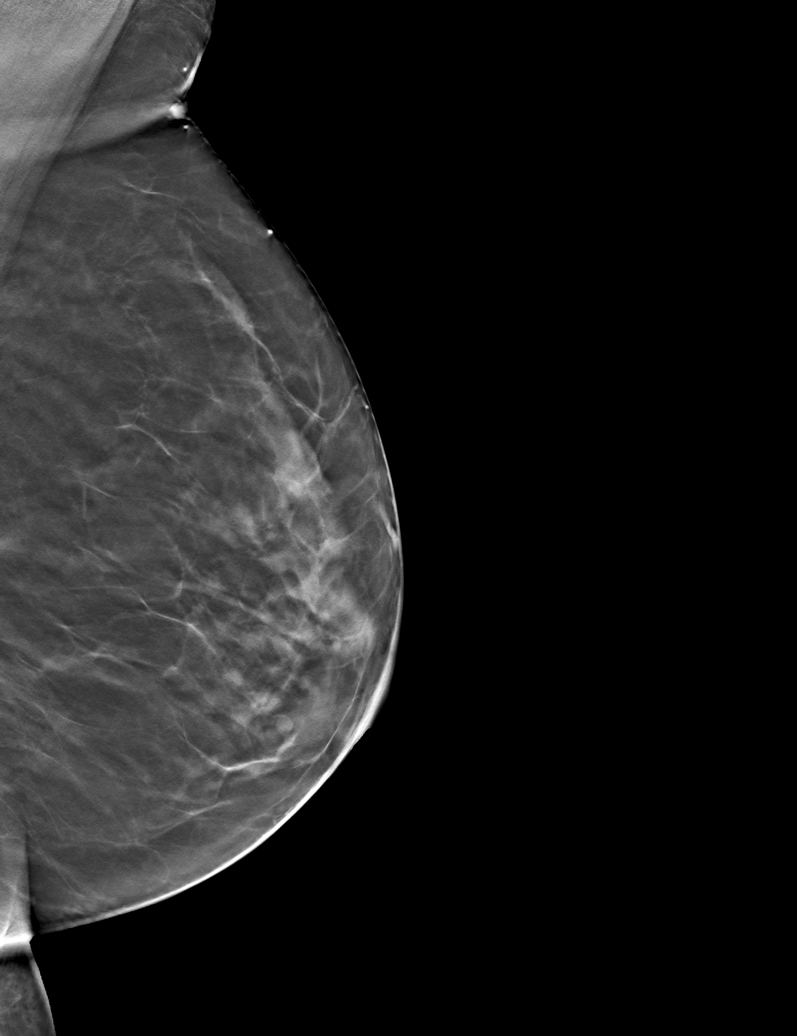

[6 of 30 positions shown; findings below may reference images not displayed]

ACR Breast Density Category b: There are scattered areas of
fibroglandular density.
FINDINGS: There are no findings suspicious for malignancy.
IMPRESSION: No mammographic evidence of malignancy. A result letter of this
screening mammogram will be mailed directly to the patient.

RECOMMENDATION:
Screening mammogram in one year. (Code:51-O-LD2)

BI-RADS CATEGORY  1: Negative.

## 2022-09-02 DIAGNOSIS — I10 Essential (primary) hypertension: Secondary | ICD-10-CM | POA: Diagnosis not present

## 2022-09-02 DIAGNOSIS — E1122 Type 2 diabetes mellitus with diabetic chronic kidney disease: Secondary | ICD-10-CM | POA: Diagnosis not present

## 2022-09-08 ENCOUNTER — Other Ambulatory Visit (HOSPITAL_COMMUNITY): Payer: Self-pay | Admitting: Internal Medicine

## 2022-09-08 DIAGNOSIS — E1122 Type 2 diabetes mellitus with diabetic chronic kidney disease: Secondary | ICD-10-CM | POA: Diagnosis not present

## 2022-09-08 DIAGNOSIS — E876 Hypokalemia: Secondary | ICD-10-CM | POA: Diagnosis not present

## 2022-09-08 DIAGNOSIS — Z6829 Body mass index (BMI) 29.0-29.9, adult: Secondary | ICD-10-CM | POA: Diagnosis not present

## 2022-09-08 DIAGNOSIS — K5900 Constipation, unspecified: Secondary | ICD-10-CM | POA: Diagnosis not present

## 2022-09-08 DIAGNOSIS — E782 Mixed hyperlipidemia: Secondary | ICD-10-CM | POA: Diagnosis not present

## 2022-09-08 DIAGNOSIS — I10 Essential (primary) hypertension: Secondary | ICD-10-CM | POA: Diagnosis not present

## 2022-09-08 DIAGNOSIS — B3731 Acute candidiasis of vulva and vagina: Secondary | ICD-10-CM | POA: Diagnosis not present

## 2022-09-08 DIAGNOSIS — Z1231 Encounter for screening mammogram for malignant neoplasm of breast: Secondary | ICD-10-CM

## 2022-09-08 DIAGNOSIS — Z7182 Exercise counseling: Secondary | ICD-10-CM | POA: Diagnosis not present

## 2022-09-08 DIAGNOSIS — E870 Hyperosmolality and hypernatremia: Secondary | ICD-10-CM | POA: Diagnosis not present

## 2022-09-08 DIAGNOSIS — M1990 Unspecified osteoarthritis, unspecified site: Secondary | ICD-10-CM | POA: Diagnosis not present

## 2022-09-08 DIAGNOSIS — Z713 Dietary counseling and surveillance: Secondary | ICD-10-CM | POA: Diagnosis not present

## 2022-09-22 ENCOUNTER — Ambulatory Visit (HOSPITAL_COMMUNITY)
Admission: RE | Admit: 2022-09-22 | Discharge: 2022-09-22 | Disposition: A | Discharge status: Home / Self Care | Payer: Medicare Other | Source: Ambulatory Visit | Attending: Internal Medicine | Admitting: Internal Medicine

## 2022-09-22 ENCOUNTER — Encounter (HOSPITAL_COMMUNITY): Payer: Self-pay

## 2022-09-22 DIAGNOSIS — Z1231 Encounter for screening mammogram for malignant neoplasm of breast: Secondary | ICD-10-CM | POA: Diagnosis not present

## 2022-12-18 DIAGNOSIS — U071 COVID-19: Secondary | ICD-10-CM | POA: Diagnosis not present

## 2022-12-18 DIAGNOSIS — Z6827 Body mass index (BMI) 27.0-27.9, adult: Secondary | ICD-10-CM | POA: Diagnosis not present

## 2022-12-18 DIAGNOSIS — E663 Overweight: Secondary | ICD-10-CM | POA: Diagnosis not present

## 2022-12-18 DIAGNOSIS — J019 Acute sinusitis, unspecified: Secondary | ICD-10-CM | POA: Diagnosis not present

## 2022-12-18 DIAGNOSIS — J101 Influenza due to other identified influenza virus with other respiratory manifestations: Secondary | ICD-10-CM | POA: Diagnosis not present

## 2023-01-30 DIAGNOSIS — Z23 Encounter for immunization: Secondary | ICD-10-CM | POA: Diagnosis not present

## 2023-02-12 DIAGNOSIS — Z23 Encounter for immunization: Secondary | ICD-10-CM | POA: Diagnosis not present

## 2023-03-10 DIAGNOSIS — E1122 Type 2 diabetes mellitus with diabetic chronic kidney disease: Secondary | ICD-10-CM | POA: Diagnosis not present

## 2023-03-10 DIAGNOSIS — I10 Essential (primary) hypertension: Secondary | ICD-10-CM | POA: Diagnosis not present

## 2023-03-16 ENCOUNTER — Other Ambulatory Visit (HOSPITAL_COMMUNITY): Payer: Self-pay | Admitting: Family Medicine

## 2023-03-16 DIAGNOSIS — K5900 Constipation, unspecified: Secondary | ICD-10-CM | POA: Diagnosis not present

## 2023-03-16 DIAGNOSIS — Z96652 Presence of left artificial knee joint: Secondary | ICD-10-CM | POA: Diagnosis not present

## 2023-03-16 DIAGNOSIS — Z7984 Long term (current) use of oral hypoglycemic drugs: Secondary | ICD-10-CM | POA: Diagnosis not present

## 2023-03-16 DIAGNOSIS — E782 Mixed hyperlipidemia: Secondary | ICD-10-CM | POA: Diagnosis not present

## 2023-03-16 DIAGNOSIS — Z79899 Other long term (current) drug therapy: Secondary | ICD-10-CM | POA: Diagnosis not present

## 2023-03-16 DIAGNOSIS — Z13820 Encounter for screening for osteoporosis: Secondary | ICD-10-CM

## 2023-03-16 DIAGNOSIS — Z780 Asymptomatic menopausal state: Secondary | ICD-10-CM

## 2023-03-16 DIAGNOSIS — E876 Hypokalemia: Secondary | ICD-10-CM | POA: Diagnosis not present

## 2023-03-16 DIAGNOSIS — I10 Essential (primary) hypertension: Secondary | ICD-10-CM | POA: Diagnosis not present

## 2023-03-16 DIAGNOSIS — E1122 Type 2 diabetes mellitus with diabetic chronic kidney disease: Secondary | ICD-10-CM | POA: Diagnosis not present

## 2023-03-16 DIAGNOSIS — E870 Hyperosmolality and hypernatremia: Secondary | ICD-10-CM | POA: Diagnosis not present

## 2023-03-30 ENCOUNTER — Ambulatory Visit (HOSPITAL_COMMUNITY)
Admission: RE | Admit: 2023-03-30 | Discharge: 2023-03-30 | Disposition: A | Discharge status: Home / Self Care | Payer: Medicare Other | Source: Ambulatory Visit | Attending: Family Medicine | Admitting: Family Medicine

## 2023-03-30 DIAGNOSIS — Z780 Asymptomatic menopausal state: Secondary | ICD-10-CM | POA: Insufficient documentation

## 2023-04-11 DIAGNOSIS — H35363 Drusen (degenerative) of macula, bilateral: Secondary | ICD-10-CM | POA: Diagnosis not present

## 2023-04-11 DIAGNOSIS — H2513 Age-related nuclear cataract, bilateral: Secondary | ICD-10-CM | POA: Diagnosis not present

## 2023-04-11 DIAGNOSIS — H524 Presbyopia: Secondary | ICD-10-CM | POA: Diagnosis not present

## 2023-04-11 DIAGNOSIS — E119 Type 2 diabetes mellitus without complications: Secondary | ICD-10-CM | POA: Diagnosis not present

## 2023-04-22 DIAGNOSIS — U071 COVID-19: Secondary | ICD-10-CM | POA: Diagnosis not present

## 2023-05-17 DIAGNOSIS — Y929 Unspecified place or not applicable: Secondary | ICD-10-CM | POA: Diagnosis not present

## 2023-05-17 DIAGNOSIS — T23072D Burn of unspecified degree of left wrist, subsequent encounter: Secondary | ICD-10-CM | POA: Diagnosis not present

## 2023-05-17 DIAGNOSIS — T300 Burn of unspecified body region, unspecified degree: Secondary | ICD-10-CM | POA: Diagnosis not present

## 2023-05-17 DIAGNOSIS — X131XXD Other contact with steam and other hot vapors, subsequent encounter: Secondary | ICD-10-CM | POA: Diagnosis not present

## 2023-07-01 DIAGNOSIS — J069 Acute upper respiratory infection, unspecified: Secondary | ICD-10-CM | POA: Diagnosis not present

## 2023-07-01 DIAGNOSIS — R051 Acute cough: Secondary | ICD-10-CM | POA: Diagnosis not present

## 2023-07-05 DIAGNOSIS — R059 Cough, unspecified: Secondary | ICD-10-CM | POA: Diagnosis not present

## 2023-07-05 DIAGNOSIS — J019 Acute sinusitis, unspecified: Secondary | ICD-10-CM | POA: Diagnosis not present

## 2023-08-17 DIAGNOSIS — H6121 Impacted cerumen, right ear: Secondary | ICD-10-CM | POA: Diagnosis not present

## 2023-08-17 DIAGNOSIS — J45901 Unspecified asthma with (acute) exacerbation: Secondary | ICD-10-CM | POA: Diagnosis not present

## 2023-08-17 DIAGNOSIS — H6123 Impacted cerumen, bilateral: Secondary | ICD-10-CM | POA: Diagnosis not present

## 2023-09-06 ENCOUNTER — Other Ambulatory Visit (HOSPITAL_COMMUNITY): Payer: Self-pay | Admitting: Internal Medicine

## 2023-09-06 DIAGNOSIS — Z1231 Encounter for screening mammogram for malignant neoplasm of breast: Secondary | ICD-10-CM

## 2023-09-14 DIAGNOSIS — I10 Essential (primary) hypertension: Secondary | ICD-10-CM | POA: Diagnosis not present

## 2023-09-14 DIAGNOSIS — E1122 Type 2 diabetes mellitus with diabetic chronic kidney disease: Secondary | ICD-10-CM | POA: Diagnosis not present

## 2023-09-15 DIAGNOSIS — Z96653 Presence of artificial knee joint, bilateral: Secondary | ICD-10-CM | POA: Diagnosis not present

## 2023-09-15 DIAGNOSIS — M25562 Pain in left knee: Secondary | ICD-10-CM | POA: Diagnosis not present

## 2023-09-21 DIAGNOSIS — M170 Bilateral primary osteoarthritis of knee: Secondary | ICD-10-CM | POA: Diagnosis not present

## 2023-09-21 DIAGNOSIS — E1122 Type 2 diabetes mellitus with diabetic chronic kidney disease: Secondary | ICD-10-CM | POA: Diagnosis not present

## 2023-09-21 DIAGNOSIS — E876 Hypokalemia: Secondary | ICD-10-CM | POA: Diagnosis not present

## 2023-09-21 DIAGNOSIS — R059 Cough, unspecified: Secondary | ICD-10-CM | POA: Diagnosis not present

## 2023-09-21 DIAGNOSIS — E870 Hyperosmolality and hypernatremia: Secondary | ICD-10-CM | POA: Diagnosis not present

## 2023-09-21 DIAGNOSIS — E782 Mixed hyperlipidemia: Secondary | ICD-10-CM | POA: Diagnosis not present

## 2023-09-21 DIAGNOSIS — I10 Essential (primary) hypertension: Secondary | ICD-10-CM | POA: Diagnosis not present

## 2023-09-21 DIAGNOSIS — K5900 Constipation, unspecified: Secondary | ICD-10-CM | POA: Diagnosis not present

## 2023-09-23 ENCOUNTER — Ambulatory Visit (HOSPITAL_COMMUNITY)
Admission: RE | Admit: 2023-09-23 | Discharge: 2023-09-23 | Disposition: A | Discharge status: Home / Self Care | Source: Ambulatory Visit | Attending: Internal Medicine | Admitting: Internal Medicine

## 2023-09-23 DIAGNOSIS — Z1231 Encounter for screening mammogram for malignant neoplasm of breast: Secondary | ICD-10-CM | POA: Insufficient documentation

## 2023-11-07 ENCOUNTER — Encounter: Payer: Self-pay | Admitting: Internal Medicine

## 2023-11-07 ENCOUNTER — Ambulatory Visit (HOSPITAL_COMMUNITY)
Admission: RE | Admit: 2023-11-07 | Discharge: 2023-11-07 | Disposition: A | Discharge status: Home / Self Care | Source: Ambulatory Visit | Attending: Internal Medicine | Admitting: Internal Medicine

## 2023-11-07 ENCOUNTER — Ambulatory Visit (INDEPENDENT_AMBULATORY_CARE_PROVIDER_SITE_OTHER): Admitting: Internal Medicine

## 2023-11-07 VITALS — BP 134/74 | HR 69 | Ht 67.0 in | Wt 161.8 lb

## 2023-11-07 DIAGNOSIS — R058 Other specified cough: Secondary | ICD-10-CM | POA: Insufficient documentation

## 2023-11-07 DIAGNOSIS — Z8616 Personal history of COVID-19: Secondary | ICD-10-CM | POA: Diagnosis not present

## 2023-11-07 DIAGNOSIS — R059 Cough, unspecified: Secondary | ICD-10-CM | POA: Diagnosis not present

## 2023-11-07 DIAGNOSIS — R918 Other nonspecific abnormal finding of lung field: Secondary | ICD-10-CM | POA: Diagnosis not present

## 2023-11-07 MED ORDER — BENZONATATE 200 MG PO CAPS: 200.0000 mg | ORAL_CAPSULE | Freq: Three times a day (TID) | ORAL | 1 refills | Status: DC | PRN

## 2023-11-07 MED ORDER — METHYLPREDNISOLONE ACETATE 80 MG/ML IJ SUSP
120.0000 mg | Freq: Once | INTRAMUSCULAR | Status: AC
  Administered 2023-11-07: 120 mg via INTRAMUSCULAR

## 2023-11-07 NOTE — Progress Notes (Signed)
 Sylvia Sparks, female    DOB: [birth year]    MRN: [id number]   Brief patient profile:  84  yobf never smoker/ asthma only as infant referred to pulmonary clinic in [place]  11/07/2023 by Rosaline Macadam  for cough since Covid in Dec 2024 march worse 2025 rx by UC with pred/dm and PCP rec albuterol  also did not help   H/o allergic rhinitis s asthma in [facility] in her 30s took shots for a year and resolved s inhaler need.  Pt not previously seen by PCCM service.     History of Present Illness  11/07/2023  Pulmonary/ 1st office eval/ Abeni Finchum / [place] [facility]  Chief Complaint  Patient presents with   Pulmonary Consult    Referred by Rosaline Macadam, FNP. Pt c/o dry cough since March 2025.   Dyspnea:  Not limited by breathing from desired activities   Cough: sporadic dry and can't smell since covid so smells not the trigger to her knowledge Sleep: flat bed / 2 pillows  SABA use: none  02: none  LDSCT:none   No obvious day to day or daytime pattern/variability or assoc excess/ purulent sputum or mucus plugs or hemoptysis or cp or chest tightness, subjective wheeze or overt sinus or hb symptoms.    Also denies any obvious fluctuation of symptoms with weather or environmental changes or other aggravating or alleviating factors except as outlined above   No unusual exposure hx or h/o childhood pna  or knowledge of premature birth.  Current Allergies, Complete Past Medical History, Past Surgical History, Family History, and Social History were reviewed in [employer] record.  ROS  The following are not active complaints unless bolded Hoarseness, sore throat, dysphagia, dental problems, itching, sneezing,  nasal congestion or discharge of excess mucus or purulent secretions, ear ache,   fever, chills, sweats, unintended wt loss or wt gain, classically pleuritic or exertional cp,  orthopnea pnd or arm/hand swelling  or leg swelling, presyncope, palpitations, abdominal  pain, anorexia, nausea, vomiting, diarrhea  or change in bowel habits or change in bladder habits, change in stools or change in urine, dysuria, hematuria,  rash, arthralgias, visual complaints, headache, numbness, weakness or ataxia or problems with walking or coordination,  change in mood or  memory.            Outpatient Medications Prior to Visit  Medication Sig Dispense Refill   amLODipine  (NORVASC ) 10 MG tablet Take 10 mg by mouth daily.     aspirin  EC (ASPIRIN  ADULT LOW DOSE) 81 MG tablet Take 81 mg by mouth daily.     cetirizine (ZYRTEC) 10 MG tablet Take 10 mg by mouth daily.     Cholecalciferol (VITAMIN D) 50 MCG (2000 UT) tablet Take 2,000 Units by mouth daily.     dapagliflozin propanediol (FARXIGA) 10 MG TABS tablet Take 10 mg by mouth daily.     Ferrous Sulfate  142 (45 Fe) MG TBCR Take 142 mg by mouth daily.     glipiZIDE  (GLUCOTROL ) 5 MG tablet Take 2.5 mg by mouth daily.     KRILL OIL PO Take by mouth.     Liniments (SALONPAS PAIN RELIEF PATCH EX) Apply 1 application topically daily as needed (left knee).      lovastatin (MEVACOR) 10 MG tablet Take 10 mg by mouth at bedtime.      Multiple Vitamins-Minerals (MULTIVITAMINS THER. W/MINERALS) TABS tablet Take 2 tablets by mouth daily.      potassium chloride  (K-DUR) 10 MEQ tablet  Take 10 mEq by mouth 2 (two) times daily.     valsartan (DIOVAN) 160 MG tablet Take 160 mg by mouth daily.     Ascorbic Acid (VITAMIN C) 1000 MG tablet Take 1,000 mg by mouth daily.     diclofenac Sodium (VOLTAREN) 1 % GEL Apply 1 application topically daily as needed (pain).     DM-APAP-CPM (CORICIDIN HBP PO) Take 2 capsules by mouth every 6 (six) hours as needed (cold.cough).     docusate sodium  (COLACE) 100 MG capsule Take 1 capsule (100 mg total) by mouth 2 (two) times daily. 10 capsule 0   HYDROcodone -acetaminophen  (NORCO/VICODIN) 5-325 MG tablet Take 1-2 tablets by mouth every 4 (four) hours as needed for severe pain. 42 tablet 0   methocarbamol   (ROBAXIN ) 500 MG tablet Take 1 tablet (500 mg total) by mouth every 6 (six) hours as needed for muscle spasms. 40 tablet 0   polyethylene glycol (MIRALAX  / GLYCOLAX ) 17 g packet Take 17 g by mouth daily as needed for mild constipation. 14 each 0   No facility-administered medications prior to visit.    Past Medical History:  Diagnosis Date   Anemia    Asthma    Diabetes mellitus without complication (HCC)    Dyspnea    with pneumonia   Hypercholesteremia    Hypertension    OA (osteoarthritis)    Pneumonia       Objective:     BP 134/74   Pulse 69   Ht 5' 7 (1.702 m)   Wt 161 lb 12.8 oz (73.4 kg)   SpO2 96% Comment: on RA  BMI 25.34 kg/m   SpO2: 96 % (on RA)   Pleasant amb bf nasal tone to voice.   HEENT : Oropharynx  cobblestoning      Nasal turbinates severe swelling and pallor    NECK :  without  apparent JVD/ palpable Nodes/TM    LUNGS: no acc muscle use,  Nl contour chest which is clear to A and P bilaterally without cough on insp or exp maneuvers   CV:  RRR  no s3 or murmur or increase in P2, and no edema   ABD:  soft and nontender   MS:  Gait nl   ext warm without deformities Or obvious joint restrictions  calf tenderness, cyanosis or clubbing    SKIN: warm and dry without lesions    NEURO:  alert, approp, nl sensorium with  no motor or cerebellar deficits apparent.    CXR PA and Lateral:   11/07/2023 :    I personally reviewed images and impression is as follows:     Mild generalized increased markings, non-specific      Assessment   Upper airway cough syndrome Onset p covid 03/2023  -Allergy screen 11/07/2023 >  Eos 0. /  IgE   - cyclical cough rx 11/07/2023 >>>   Upper airway cough syndrome (previously labeled PNDS),  is so named because it's frequently impossible to sort out how much is  CR/sinusitis with freq throat clearing (which can be related to primary GERD)   vs  causing  secondary ( extra esophageal)  GERD from wide swings in  gastric pressure that occur with throat clearing, often  promoting self use of mint and menthol  lozenges that reduce the lower esophageal sphincter tone and exacerbate the problem further in a cyclical fashion.   These are the same pts (now being labeled as having irritable larynx syndrome by some cough centers) who not  infrequently have a history of having failed to tolerate ace inhibitors,  dry powder inhalers or biphosphonates or report having atypical/extraesophageal reflux symptoms (LPR/sinus symptoms)  that don't respond to standard doses of PPI  and are easily confused as having aecopd or asthma flares by even experienced allergists/ pulmonologists (myself included).   Rec: Max gerd rx Suppress cough with delsym/tessalon  200 Depomedrol 120 mg IM   in case of component of Th-2 driven upper or lower airways inflammation (if cough responds short term only to relapse before return while will on full rx for uacs (as above), then that would point to allergic rhinitis/ asthma or eos bronchitis as alternative dx)    F/u in 4 weeks sooner if needed    Each maintenance medication was reviewed in detail including emphasizing most importantly the difference between maintenance and prns and under what circumstances the prns are to be triggered using an action plan format where appropriate.  Total time for H and P, chart review, counseling,  and generating customized AVS unique to this office visit / same day charting = 48 min new pt eval > for chronic   refractory respiratory  symptoms of uncertain etiology            Ozell America, MD 11/07/2023

## 2023-11-07 NOTE — Assessment & Plan Note (Addendum)
 Onset p covid 03/2023  -Allergy screen 11/07/2023 >  Eos 0. /  IgE   - cyclical cough rx 11/07/2023 >>>   Upper airway cough syndrome (previously labeled PNDS),  is so named because it's frequently impossible to sort out how much is  CR/sinusitis with freq throat clearing (which can be related to primary GERD)   vs  causing  secondary ( extra esophageal)  GERD from wide swings in gastric pressure that occur with throat clearing, often  promoting self use of mint and menthol  lozenges that reduce the lower esophageal sphincter tone and exacerbate the problem further in a cyclical fashion.   These are the same pts (now being labeled as having irritable larynx syndrome by some cough centers) who not infrequently have a history of having failed to tolerate ace inhibitors,  dry powder inhalers or biphosphonates or report having atypical/extraesophageal reflux symptoms (LPR/sinus symptoms)  that don't respond to standard doses of PPI  and are easily confused as having aecopd or asthma flares by even experienced allergists/ pulmonologists (myself included).   Rec: Max gerd rx Suppress cough with delsym/tessalon  200 Depomedrol 120 mg IM   in case of component of Th-2 driven upper or lower airways inflammation (if cough responds short term only to relapse before return while will on full rx for uacs (as above), then that would point to allergic rhinitis/ asthma or eos bronchitis as alternative dx)    F/u in 4 weeks sooner if needed    Each maintenance medication was reviewed in detail including emphasizing most importantly the difference between maintenance and prns and under what circumstances the prns are to be triggered using an action plan format where appropriate.  Total time for H and P, chart review, counseling,  and generating customized AVS unique to this office visit / same day charting = 48 min new pt eval > for chronic   refractory respiratory  symptoms of uncertain etiology

## 2023-11-07 NOTE — Patient Instructions (Signed)
 For cough > tessalpn 200 mg every 4 -6 hours as needed   Take delsym two tsp every 12 hours and supplement  tessalon  200 mg  every 4-6 hours to suppress the urge to cough. Swallowing water  and/or using ice chips/non mint and menthol  containing candies (such as lifesavers or sugarless jolly ranchers) are also effective.  You should rest your voice and avoid activities that you know make you cough.  Once you have eliminated the cough for 3 straight days try reducing the ttessalon first,  then the delsym as tolerated.    Pantoprazole  (protonix ) 40 mg   Take  30-60 min before first meal of the day and Pepcid  (famotidine )  20 mg after supper until return to office - this is the best way to tell whether stomach acid is contributing to your problem.    Depomedrol 120 mg IM   Please remember to go to the lab department   for your tests - we will call you with the results when they are available.      Please remember to go to the  x-ray department  for your tests - we will call you with the results when they are available    Please schedule a follow up office visit in  4 weeks, call sooner if needed

## 2023-11-08 ENCOUNTER — Telehealth: Payer: Self-pay | Admitting: *Deleted

## 2023-11-08 ENCOUNTER — Ambulatory Visit: Payer: Self-pay | Admitting: Internal Medicine

## 2023-11-08 DIAGNOSIS — R768 Other specified abnormal immunological findings in serum: Secondary | ICD-10-CM

## 2023-11-08 DIAGNOSIS — R058 Other specified cough: Secondary | ICD-10-CM

## 2023-11-08 MED ORDER — PANTOPRAZOLE SODIUM 40 MG PO TBEC
DELAYED_RELEASE_TABLET | ORAL | 2 refills | Status: DC
Start: 2023-11-08 — End: 2023-12-01

## 2023-11-08 MED ORDER — FAMOTIDINE 20 MG PO TABS: 20.0000 mg | ORAL_TABLET | Freq: Every day | ORAL | 2 refills | Status: DC

## 2023-11-08 NOTE — Progress Notes (Signed)
Spoke with pt and notified of results per Dr. Wert. Pt verbalized understanding and denied any questions. 

## 2023-11-08 NOTE — Telephone Encounter (Signed)
 Copied from CRM [id number]. Topic: Clinical - Prescription Issue >> Nov 08, 2023  8:59 AM Benton KIDD wrote: Reason for CRM: patient is calling cause she seen dr wert yesterday and he prescribed pantoprazole  protonix  40 mg but you can't get this over the counter patient is needing this medication to be called in to the pharmacy   [id number] - [place], [place] - [address] AT [facility] [address] [place] [address] Phone: [phone] Fax: [phone] Hours: Not open 24 hours  Pantoprazole  sent 12/01/23

## 2023-11-09 LAB — CBC WITH DIFFERENTIAL/PLATELET
Basophils Absolute: 0.1 x10E3/uL (ref 0.0–0.2)
Basos: 1 %
EOS (ABSOLUTE): 0.4 x10E3/uL (ref 0.0–0.4)
Eos: 10 %
Hematocrit: 42.8 % (ref 34.0–46.6)
Hemoglobin: 13.6 g/dL (ref 11.1–15.9)
Immature Grans (Abs): 0 x10E3/uL (ref 0.0–0.1)
Immature Granulocytes: 0 %
Lymphocytes Absolute: 1.5 x10E3/uL (ref 0.7–3.1)
Lymphs: 33 %
MCH: 30 pg (ref 26.6–33.0)
MCHC: 31.8 g/dL (ref 31.5–35.7)
MCV: 95 fL (ref 79–97)
Monocytes Absolute: 0.8 x10E3/uL (ref 0.1–0.9)
Monocytes: 17 %
Neutrophils Absolute: 1.8 x10E3/uL (ref 1.4–7.0)
Neutrophils: 38 %
Platelets: 229 x10E3/uL (ref 150–450)
RBC: 4.53 x10E6/uL (ref 3.77–5.28)
RDW: 14 % (ref 11.7–15.4)
WBC: 4.6 x10E3/uL (ref 3.4–10.8)

## 2023-11-09 LAB — IGE: IgE (Immunoglobulin E), Serum: 1869 [IU]/mL — ABNORMAL HIGH (ref 6–495)

## 2023-11-10 NOTE — Progress Notes (Signed)
Spoke with pt and notified of results per Dr. Melvyn Novas. Pt verbalized understanding and denied any questions. She was agreeable to allergy referral and this was placed.

## 2023-12-01 ENCOUNTER — Other Ambulatory Visit: Payer: Self-pay | Admitting: Internal Medicine

## 2023-12-05 ENCOUNTER — Ambulatory Visit: Admitting: Internal Medicine

## 2024-01-15 NOTE — Progress Notes (Signed)
 Sylvia Sparks, female    DOB: 22-Dec-1945    MRN: [id number]   Brief patient profile:  50  yobf never smoker/ asthma only as infant referred to pulmonary clinic in [place]  11/07/2023 by Rosaline Macadam  for cough since Covid in Dec 2024 march worse 2025 rx by [facility] with pred/dm and PCP rec albuterol  also did not help   H/o allergic rhinitis s asthma in [facility] in her 30s took shots for a year and resolved s inhaler need.  Pt not previously seen by PCCM service.     History of Present Illness  11/07/2023  Pulmonary/ 1st office eval/ Cinthia Rodden / [place] [facility]  Chief Complaint  Patient presents with   Pulmonary Consult    Referred by Rosaline Macadam, FNP. Pt c/o dry cough since March 2025.   Dyspnea:  Not limited by breathing from desired activities   Cough: sporadic dry and can't smell since covid so smells not the trigger to her knowledge Sleep: flat bed / 2 pillows  SABA use: none  02: none  LDSCT:none  Rec For cough > tessalpn 200 mg every 4 -6 hours as needed  Take delsym two tsp every 12 hours and supplement  tessalon  200 mg  every 4-6 hours     Once you have eliminated the cough for 3 straight days try reducing the ttessalon first,  then the delsym as tolerated.  Pantoprazole  (protonix ) 40 mg   Take  30-60 min before first meal of the day and Pepcid  (famotidine )  20 mg after supper until return to office Depomedrol 120 mg IM Cxr post covid changes?    Allergy screen 11/07/2023 >  Eos 0.4/  IgE  1869 > referred to allergy 11/10/2023   01/19/2024  f/u ov/[place] [facility]/Greysyn Vanderberg re: cough  maint on zyrtec and gerd rx thru today but holding for allergy tests 01/23/24    ? Post covid changes on cxr?  Chief Complaint  Patient presents with   Cough    Overall, cough is improved.  Doing well.   Dyspnea:  Not limited by breathing from desired activities   Cough: much better/ no tessalon  needed  Sleeping: flat bed/ 2 pillows occ noct cough resolves p swig of water   SABA use: none       No obvious day to day or daytime variability or assoc excess/ purulent sputum or mucus plugs or hemoptysis or cp or chest tightness, subjective wheeze or overt sinus or hb symptoms.    Also denies any obvious fluctuation of symptoms with weather or environmental changes or other aggravating or alleviating factors except as outlined above   No unusual exposure hx or h/o childhood pna  or knowledge of premature birth.  Current Allergies, Complete Past Medical History, Past Surgical History, Family History, and Social History were reviewed in [employer] record.  ROS  The following are not active complaints unless bolded Hoarseness, sore throat, dysphagia, dental problems, itching, sneezing,  nasal congestion or discharge of excess mucus/ nasal  or purulent secretions, ear ache,   fever, chills, sweats, unintended wt loss or wt gain, classically pleuritic or exertional cp,  orthopnea pnd or arm/hand swelling  or leg swelling, presyncope, palpitations, abdominal pain, anorexia, nausea, vomiting, diarrhea  or change in bowel habits or change in bladder habits, change in stools or change in urine, dysuria, hematuria,  rash, arthralgias, visual complaints, headache, numbness, weakness or ataxia or problems with walking or coordination,  change in mood or  memory.  Current Meds  Medication Sig   amLODipine  (NORVASC ) 10 MG tablet Take 10 mg by mouth daily.   ascorbic acid (VITAMIN C) 250 MG CHEW    aspirin  EC (ASPIRIN  ADULT LOW DOSE) 81 MG tablet Take 81 mg by mouth daily.   cetirizine (ZYRTEC) 10 MG tablet Take 10 mg by mouth daily.   Cholecalciferol (VITAMIN D) 50 MCG (2000 UT) tablet Take 2,000 Units by mouth daily.   dapagliflozin propanediol (FARXIGA) 10 MG TABS tablet Take 10 mg by mouth daily.   Ferrous Sulfate  142 (45 Fe) MG TBCR Take 142 mg by mouth daily.   glipiZIDE  (GLUCOTROL ) 5 MG tablet Take 2.5 mg by mouth daily.   Liniments (SALONPAS PAIN RELIEF PATCH  EX) Apply 1 application topically daily as needed (left knee).    lovastatin (MEVACOR) 10 MG tablet Take 10 mg by mouth at bedtime.    Multiple Vitamins-Minerals (MULTIVITAMINS THER. W/MINERALS) TABS tablet Take 2 tablets by mouth daily.    pantoprazole  (PROTONIX ) 40 MG tablet TAKE ONE TABLET 30-60 MIN PRIOR TO FIRST MEAL DAILY   SODIUM FLUORIDE 5000 PPM 1.1 % GEL dental gel at bedtime.   valsartan (DIOVAN) 160 MG tablet Take 160 mg by mouth daily.                 Past Medical History:  Diagnosis Date   Anemia    Asthma    Diabetes mellitus without complication (HCC)    Dyspnea    with pneumonia   Hypercholesteremia    Hypertension    OA (osteoarthritis)    Pneumonia       Objective:     Wt Readings from Last 3 Encounters:  01/19/24 165 lb 9.6 oz (75.1 kg)  01/16/24 165 lb (74.8 kg)  11/07/23 161 lb 12.8 oz (73.4 kg)      Vital signs reviewed  01/19/2024  - Note at rest 02 sats  100% on RA   General appearance:    pleasant slt nasal tone bf/ freq laughter s coughing produced     HEENT : Oropharynx  clear      Nasal turbinates nl    NECK :  without  apparent JVD/ palpable Nodes/TM    LUNGS: no acc muscle use,  Nl contour chest which is clear to A and P bilaterally without cough on insp or exp maneuvers   CV:  RRR  no s3 or murmur or increase in P2, and no edema   ABD:  soft and nontender   MS:  Gait nl   ext warm without deformities Or obvious joint restrictions  calf tenderness, cyanosis or clubbing    SKIN: warm and dry without lesions    NEURO:  alert, approp, nl sensorium with  no motor or cerebellar deficits apparent.     CXR PA and Lateral:   11/07/2023 :    I personally reviewed images and impression is as follows:     Mild generalized increased markings, non-specific      Assessment   Assessment & Plan Upper airway cough syndrome Onset p covid 03/2023  - Allergy screen 11/07/2023 >  Eos 0.4/  IgE  1869 > referred to allergy 11/10/2023 > seen  01/16/24 rec leave off all inhalers and try off gerd rx (apparently did no stop them yet though) - cyclical cough rx 11/07/2023 >>>  resolved as of  01/19/2024 so rec continue with allergy rrecs and f/u pulmonary prn   Discussed in detail all the  indications, usual  risks and alternatives  relative to the benefits with patient who agrees to proceed with Rx as outlined.             Each maintenance medication was reviewed in detail including emphasizing most importantly the difference between maintenance and prns and under what circumstances the prns are to be triggered using an action plan format where appropriate.  Total time for H and P, chart review, counseling, and generating customized AVS unique to this office visit / same day charting = 23 min summary final f/u ov           AVS  Patient Instructions  Keep up with Dr Anthony recommendations and return here as needed      Ozell America, MD 01/21/2024

## 2024-01-16 ENCOUNTER — Ambulatory Visit (INDEPENDENT_AMBULATORY_CARE_PROVIDER_SITE_OTHER): Payer: Self-pay | Admitting: Internal Medicine

## 2024-01-16 ENCOUNTER — Encounter: Payer: Self-pay | Admitting: Internal Medicine

## 2024-01-16 VITALS — BP 130/70 | HR 76 | Temp 97.9°F | Resp 16 | Ht 63.78 in | Wt 165.0 lb

## 2024-01-16 DIAGNOSIS — J3089 Other allergic rhinitis: Secondary | ICD-10-CM

## 2024-01-16 DIAGNOSIS — R053 Chronic cough: Secondary | ICD-10-CM

## 2024-01-16 NOTE — Patient Instructions (Addendum)
 Chronic Cough - Likely related to post infectious cough and post nasal drainage  - Stop Protonix /Pepcid .  If symptoms worsen with stopping it, restart the medications.   Other Allergic Rhinitis: - Use nasal saline rinses before nose sprays such as with Neilmed Sinus Rinse.  Use distilled water .   - Use Zyrtec 10 mg daily.   Hold all anti-histamines (Xyzal, Allegra, Zyrtec, Claritin , Benadryl , Pepcid ) 3 days prior to next visit.  Follow up: 9/29 at 830 for skin testing 1-55

## 2024-01-16 NOTE — Progress Notes (Signed)
 NEW PATIENT  Date of Service/Encounter:  01/16/24  Consult requested by: Shona Norleen PEDLAR, MD   Subjective:   Sylvia Sparks (DOB: 04/05/1946) is a 78 y.o. female who presents to the clinic on 01/16/2024 with a chief complaint of Cough .    History obtained from: chart review and patient.   Cough: Started after an URI in March 2025 and prior had COVID in December. Cough has been persistent since then but does note improvement with tessalon  perles that were given to her recently.  Asthma as a baby but then never had issues later with dyspnea/wheeze. Tried Albuterol  and Wixela and did not help.  Started on Protonix  and Pepcid  daily in July but no improvement, denies heartburn/reflux/sour taste in mouth/worsening AM cough.  Dry cough, on and off throughout day/night. Does note on and off post nasal drainage.    Rhinitis:  Started many years ago.  Symptoms include: nasal congestion and post nasal drainage  Occurs seasonally-Spring Potential triggers: not sure  Treatments tried:  Thinks she did AIT Zyrtec daily   Previous allergy testing: yes about 30 years ago, can't recall results  History of sinus surgery: no Nonallergic triggers: none   Reviewed:  11/07/2023: seen by Dr Darlean for cough, Rx tessalon  perles and protonix /pepcid .   IgE 12/2023: 1869, AEC 400  CXR 11/07/2023: IMPRESSION: Vague scattered interstitial opacities throughout both lungs, which may be related to COVID-19 pneumonia.   Past Medical History: Past Medical History:  Diagnosis Date   Anemia    Asthma    Diabetes mellitus without complication (HCC)    Dyspnea    with pneumonia   Hypercholesteremia    Hypertension    OA (osteoarthritis)    Pneumonia    Past Surgical History: Past Surgical History:  Procedure Laterality Date   COLONOSCOPY N/A 06/20/2015   Procedure: COLONOSCOPY;  Surgeon: Lamar CHRISTELLA Hollingshead, MD;  Location: [facility];  Service: Endoscopy;  Laterality: N/A;  9:00 AM   COLONOSCOPY  N/A 03/07/2019   Procedure: COLONOSCOPY;  Surgeon: Hollingshead Lamar CHRISTELLA, MD;  Location: [facility];  Service: Endoscopy;  Laterality: N/A;  1:00   POLYPECTOMY N/A 06/20/2015   Procedure: POLYPECTOMY;  Surgeon: Lamar CHRISTELLA Hollingshead, MD;  Location: [facility];  Service: Endoscopy;  Laterality: N/A;  Hepatic flexure polyps x 2/ Polyp opposite ileocecal valve   POLYPECTOMY  03/07/2019   Procedure: POLYPECTOMY;  Surgeon: Hollingshead Lamar CHRISTELLA, MD;  Location: [facility];  Service: Endoscopy;;  transverse   TOTAL KNEE ARTHROPLASTY Right 05/09/2018   Procedure: TOTAL KNEE ARTHROPLASTY;  Surgeon: Ernie Cough, MD;  Location: [facility];  Service: Orthopedics;  Laterality: Right;  70 mins   TOTAL KNEE ARTHROPLASTY Left 11/25/2020   Procedure: TOTAL KNEE ARTHROPLASTY;  Surgeon: Ernie Cough, MD;  Location: [facility];  Service: Orthopedics;  Laterality: Left;    Family History: Family History  Problem Relation Age of Onset   Allergic rhinitis Neg Hx    Angioedema Neg Hx    Asthma Neg Hx    Eczema Neg Hx    Urticaria Neg Hx      Medication List:  Allergies as of 01/16/2024   No Known Allergies      Medication List        Accurate as of January 16, 2024 12:10 PM. If you have any questions, ask your nurse or doctor.          amLODipine  10 MG tablet Commonly known as: NORVASC  Take 10 mg by mouth  daily.   ascorbic acid 250 MG Chew Commonly known as: VITAMIN C   Aspirin  Adult Low Dose 81 MG tablet Generic drug: aspirin  EC Take 81 mg by mouth daily.   benzonatate  200 MG capsule Commonly known as: TESSALON  Take 1 capsule (200 mg total) by mouth 3 (three) times daily as needed for cough.   cetirizine 10 MG tablet Commonly known as: ZYRTEC Take 10 mg by mouth daily.   famotidine  20 MG tablet Commonly known as: PEPCID  TAKE 1 TABLET BY MOUTH EVERYDAY AT BEDTIME   Farxiga 10 MG Tabs tablet Generic drug: dapagliflozin propanediol Take 10 mg by mouth daily.   Ferrous Sulfate  142 (45 Fe) MG  Tbcr Take 142 mg by mouth daily.   glipiZIDE  5 MG tablet Commonly known as: GLUCOTROL  Take 2.5 mg by mouth daily.   lovastatin 10 MG tablet Commonly known as: MEVACOR Take 10 mg by mouth at bedtime.   multivitamins ther. w/minerals Tabs tablet Take 2 tablets by mouth daily.   pantoprazole  40 MG tablet Commonly known as: PROTONIX  TAKE ONE TABLET 30-60 MIN PRIOR TO FIRST MEAL DAILY   potassium chloride  10 MEQ tablet Commonly known as: KLOR-CON  Take 10 mEq by mouth 2 (two) times daily.   ProAir  HFA 108 (90 Base) MCG/ACT inhaler Generic drug: albuterol  USE 1 TO 2 INHALATIONS      ORALLY EVERY 4 TO 6 HOURS   AS NEEDED FOR SHORTNESS OF  BREATH   SALONPAS PAIN RELIEF PATCH EX Apply 1 application topically daily as needed (left knee).   Sodium Fluoride 5000 PPM 1.1 % Gel dental gel Generic drug: sodium fluoride at bedtime.   valsartan 160 MG tablet Commonly known as: DIOVAN Take 160 mg by mouth daily.   Vitamin D 50 MCG (2000 UT) tablet Take 2,000 Units by mouth daily.   Wixela Inhub 100-50 MCG/ACT Aepb Generic drug: fluticasone-salmeterol         REVIEW OF SYSTEMS: Pertinent positives and negatives discussed in HPI.   Objective:   Physical Exam: BP 130/70   Pulse 76   Temp 97.9 F (36.6 C)   Resp 16   Ht 5' 3.78 (1.62 m)   Wt 165 lb (74.8 kg)   SpO2 96%   BMI 28.52 kg/m  Body mass index is 28.52 kg/m. GEN: alert, well developed HEENT: clear conjunctiva, nose with + mild inferior turbinate hypertrophy, pink nasal mucosa, slight clear rhinorrhea, + cobblestoning HEART: regular rate and rhythm, no murmur LUNGS: clear to auscultation bilaterally, no coughing, unlabored respiration ABDOMEN: soft, non distended  SKIN: no rashes or lesions  Spirometry:  Tracings reviewed. Her effort: Good reproducible efforts. FVC: 2.43L, 111% predicted FEV1: 1.87L, 111% predicted FEV1/FVC ratio: 77% Interpretation: Spirometry consistent with normal pattern.  Please  see scanned spirometry results for details.  Assessment:   1. Other allergic rhinitis   2. Chronic cough     Plan/Recommendations:  Chronic Cough - Likely related to post infectious cough and post nasal drainage  - Stop Protonix /Pepcid .  If symptoms worsen with stopping it, restart the medications as this would indicate you have silent reflux.   - Low suspicion for asthma- normal spirometry, no response to Albuterol  and Wixela.    Other Allergic Rhinitis: - Due to turbinate hypertrophy, seasonal symptoms, chronic cough and unresponsive to over the counter meds, will perform skin testing to identify aeroallergen triggers.   - Use nasal saline rinses before nose sprays such as with Neilmed Sinus Rinse.  Use distilled water .   -  Use Zyrtec 10 mg daily.   Hold all anti-histamines (Xyzal, Allegra, Zyrtec, Claritin , Benadryl , Pepcid ) 3 days prior to next visit.  Follow up: 9/29 at 830 for skin testing 1-55, IDs okay    Arleta Blanch, MD [facility] 

## 2024-01-19 ENCOUNTER — Encounter: Payer: Self-pay | Admitting: Internal Medicine

## 2024-01-19 ENCOUNTER — Ambulatory Visit (INDEPENDENT_AMBULATORY_CARE_PROVIDER_SITE_OTHER): Admitting: Internal Medicine

## 2024-01-19 VITALS — BP 145/78 | HR 61 | Temp 98.0°F | Ht 64.0 in | Wt 165.6 lb

## 2024-01-19 DIAGNOSIS — R058 Other specified cough: Secondary | ICD-10-CM | POA: Diagnosis not present

## 2024-01-19 MED ORDER — BENZONATATE 200 MG PO CAPS: 200.0000 mg | ORAL_CAPSULE | Freq: Three times a day (TID) | ORAL | 2 refills | Status: AC | PRN

## 2024-01-21 NOTE — Patient Instructions (Signed)
 Keep up with Dr Anthony recommendations and return here as needed

## 2024-01-21 NOTE — Assessment & Plan Note (Addendum)
 Onset p covid 03/2023  - Allergy screen 11/07/2023 >  Eos 0.4/  IgE  1869 > referred to allergy 11/10/2023 > seen 01/16/24 rec leave off all inhalers and try off gerd rx (apparently did no stop them yet though) - cyclical cough rx 11/07/2023 >>>  resolved as of  01/19/2024 so rec continue with allergy rrecs and f/u pulmonary prn   Discussed in detail all the  indications, usual  risks and alternatives  relative to the benefits with patient who agrees to proceed with Rx as outlined.             Each maintenance medication was reviewed in detail including emphasizing most importantly the difference between maintenance and prns and under what circumstances the prns are to be triggered using an action plan format where appropriate.  Total time for H and P, chart review, counseling, and generating customized AVS unique to this office visit / same day charting = 23 min summary final f/u ov

## 2024-01-23 ENCOUNTER — Ambulatory Visit (INDEPENDENT_AMBULATORY_CARE_PROVIDER_SITE_OTHER): Admitting: Internal Medicine

## 2024-01-23 DIAGNOSIS — J301 Allergic rhinitis due to pollen: Secondary | ICD-10-CM

## 2024-01-23 DIAGNOSIS — J3081 Allergic rhinitis due to animal (cat) (dog) hair and dander: Secondary | ICD-10-CM

## 2024-01-23 DIAGNOSIS — J3089 Other allergic rhinitis: Secondary | ICD-10-CM

## 2024-01-23 MED ORDER — FLUTICASONE PROPIONATE 50 MCG/ACT NA SUSP: 2.0000 | Freq: Every day | NASAL | 5 refills | Status: DC

## 2024-01-23 MED ORDER — AZELASTINE HCL 0.1 % NA SOLN: 2.0000 | Freq: Two times a day (BID) | NASAL | 5 refills | Status: DC | PRN

## 2024-01-23 MED ORDER — CETIRIZINE HCL 10 MG PO TABS: 10.0000 mg | ORAL_TABLET | Freq: Every day | ORAL | 5 refills | Status: DC

## 2024-01-23 NOTE — Progress Notes (Signed)
 FOLLOW UP Date of Service/Encounter:  01/23/24   Subjective:  Sylvia Sparks (DOB: 04/12/1946) is a 78 y.o. female who [facility] for follow up for skin testing.   History obtained from: chart review and patient.  Anti histamines held.   Past Medical History: Past Medical History:  Diagnosis Date   Anemia    Asthma    Diabetes mellitus without complication (HCC)    Dyspnea    with pneumonia   Hypercholesteremia    Hypertension    OA (osteoarthritis)    Pneumonia     Objective:  There were no vitals taken for this visit. There is no height or weight on file to calculate BMI. Physical Exam: GEN: alert, well developed HEENT: clear conjunctiva, MMM LUNGS: unlabored respiration  Skin Testing:  Skin prick testing was placed, which includes aeroallergens/foods, histamine control, and saline control.  Verbal consent was obtained prior to placing test.  Patient tolerated procedure well.  Allergy testing results were read and interpreted by myself, documented by clinical staff. Adequate positive and negative control.  Positive results to:  Results discussed with patient/family.  Airborne Adult Perc - 01/23/24 0833     Time Antigen Placed 9166    Allergen Manufacturer Jestine    Location Back    Number of Test 55    1. Control-Buffer 50% Glycerol Negative    2. Control-Histamine 3+    3. Bahia Negative    4. French Southern Territories Negative    5. Johnson Negative    6. [place]  Blue Negative    7. Meadow Fescue Negative    8. Perennial Rye Negative    9. Timothy Negative    10. Ragweed Mix Negative    11. Cocklebur Negative    12. Plantain,  English Negative    13. Baccharis Negative    14. Dog Fennel Negative    15. Russian Thistle Negative    16. Lamb's Quarters Negative    17. Sheep Sorrell Negative    18. Rough Pigweed Negative    19. Marsh Elder, Rough Negative    20. Mugwort, Common Negative    21. Box, Elder Negative    22.  Cedar, red Negative    23. Sweet Gum Negative    24. Pecan Pollen Negative    25. Pine Mix Negative    26. Walnut, Black Pollen Negative    27. Red Mulberry Negative    28. Ash Mix Negative    29. Birch Mix Negative    30. Beech American Negative    31. Cottonwood, Guinea-Bissau Negative    32. Hickory, White Negative    33. Maple Mix Negative    34. Oak, Guinea[place] Mix Negative    35. Sycamore Eastern Negative    36. Alternaria Alternata Negative    37. Cladosporium Herbarum Negative    38. Aspergillus Mix Negative    39. Penicillium Mix Negative    40. Bipolaris Sorokiniana (Helminthosporium) Negative    41. Drechslera Spicifera (Curvularia) Negative    42. Mucor Plumbeus Negative    43. Fusarium Moniliforme Negative    44. Aureobasidium Pullulans (pullulara) Negative    45. Rhizopus Oryzae Negative    46. Botrytis Cinera Negative    47. Epicoccum Nigrum Negative    48. Phoma Betae Negative    49. Dust Mite Mix 3+    50. Cat Hair 10,000 BAU/ml 3+    51.  Dog Epithelia Negative    52. Mixed Feathers  Negative    53. Horse Epithelia Negative    54. Cockroach, German Negative    55. Tobacco Leaf Negative          Intradermal - 01/23/24 0945     Time Antigen Placed 0930    Allergen Manufacturer Jestine    Location Arm    Number of Test 14    Control Negative    Bahia 3+    French Southern Territories Negative    Johnson Negative    7 Grass Negative    Ragweed Mix Negative    Weed Mix Negative    Tree Mix 2+    Mold 1 Negative    Mold 2 2+    Mold 3 Negative    Mold 4 3+    Dog Negative    Cockroach Negative           Assessment:   1. Seasonal allergic rhinitis due to pollen   2. Allergic rhinitis due to dust mite   3. Allergic rhinitis due to animal hair or dander   4. Allergic rhinitis caused by mold     Plan/Recommendations:  Chronic Cough - Likely related to post infectious cough and post nasal drainage. Will treat PND as discussed below.  - Stop Protonix /Pepcid .  If  symptoms worsen with stopping it, restart the medications as this would indicate you have silent reflux.   - Low suspicion for asthma- normal spirometry, no response to Albuterol  and Wixela.     Allergic Rhinitis: - Due to turbinate hypertrophy, seasonal symptoms, chronic cough and unresponsive to over the counter meds, will perform skin testing to identify aeroallergen triggers.   - Positive skin test 12/2023: grasses, trees, molds, dust mite, cats - Avoidance measures discussed. - Use nasal saline rinses before nose sprays such as with Neilmed Sinus Rinse.  Use distilled water .   - Use Flonase 2 sprays each nostril daily. Aim upward and outward. - Use Azelastine 2 sprays each nostril twice daily as needed for runny nose, drainage, sneezing, congestion. Aim upward and outward. - Use Zyrtec 10 mg daily.   - Consider allergy shots as long term control of your symptoms by teaching your immune system to be more tolerant of your allergy triggers   ALLERGEN AVOIDANCE MEASURES   Dust Mites Use central air conditioning and heat; and change the filter monthly.  Pleated filters work better than mesh filters.  Electrostatic filters may also be used; wash the filter monthly.  Window air conditioners may be used, but do not clean the air as well as a central air conditioner.  Change or wash the filter monthly. Keep windows closed.  Do not use attic fans.   Encase the mattress, box springs and pillows with zippered, dust proof covers. Wash the bed linens in hot water  weekly.   Remove carpet, especially from the bedroom. Remove stuffed animals, throw pillows, dust ruffles, heavy drapes and other items that collect dust from the bedroom. Do not use a humidifier.   Use wood, vinyl or leather furniture instead of cloth furniture in the bedroom. Keep the indoor humidity at 30 - 40%.    Molds - Indoor avoidance Use air conditioning to reduce indoor humidity.  Do not use a humidifier. Keep indoor humidity  at 30 - 40%.  Use a dehumidifier if needed. In the bathroom use an exhaust fan or open a window after showering.  Wipe down damp surfaces after showering.  Clean bathrooms with a mold-killing solution (diluted bleach, or products like Tilex,  etc) at least once a month. In the kitchen use an exhaust fan to remove steam from cooking.  Throw away spoiled foods immediately, and empty garbage daily.  Empty water  pans below self-defrosting refrigerators frequently. Vent the clothes dryer to the outside. Limit indoor houseplants; mold grows in the dirt.  No houseplants in the bedroom. Remove carpet from the bedroom. Encase the mattress and box springs with a zippered encasing.  Molds - Outdoor avoidance Avoid being outside when the grass is being mowed, or the ground is tilled. Avoid playing in leaves, pine straw, hay, etc.  Dead plant materials contain mold. Avoid going into barns or grain storage areas. Remove leaves, clippings and compost from around the home.  Pollen Avoidance Pollen levels are highest during the mid-day and afternoon.  Consider this when planning outdoor activities. Avoid being outside when the grass is being mowed, or wear a mask if the pollen-allergic person must be the one to mow the grass. Keep the windows closed to keep pollen outside of the home. Use an air conditioner to filter the air. Take a shower, wash hair, and change clothing after working or playing outdoors during pollen season. Pet Dander- Cats  Keep the pet out of your bedroom and restrict it to only a few rooms. Be advised that keeping the pet in only one room will not limit the allergens to that room. Don't pet, hug or kiss the pet; if you do, wash your hands with soap and water . High-efficiency particulate air (HEPA) cleaners run continuously in a bedroom or living room can reduce allergen levels over time. Regular use of a high-efficiency vacuum cleaner or a central vacuum can reduce allergen  levels. Giving your pet a bath at least once a week can reduce airborne allergen.    Return in about 2 months (around 03/24/2024).  Arleta Blanch, MD [facility] 

## 2024-01-23 NOTE — Patient Instructions (Addendum)
 Chronic Cough - Likely related to post infectious cough and post nasal drainage  - Stop Protonix /Pepcid .  If symptoms worsen with stopping it, restart the medications as this would indicate you have silent reflux.   - Low suspicion for asthma- normal spirometry, no response to Albuterol  and Wixela.     Allergic Rhinitis:   - Positive skin test 12/2023: grasses, trees, molds, dust mite, cats - Use nasal saline rinses before nose sprays such as with Neilmed Sinus Rinse.  Use distilled water .   - Use Flonase 2 sprays each nostril daily. Aim upward and outward. - Use Azelastine 2 sprays each nostril twice daily as needed for runny nose, drainage, sneezing, congestion. Aim upward and outward. - Use Zyrtec 10 mg daily.   - Consider allergy shots as long term control of your symptoms by teaching your immune system to be more tolerant of your allergy triggers   ALLERGEN AVOIDANCE MEASURES   Dust Mites Use central air conditioning and heat; and change the filter monthly.  Pleated filters work better than mesh filters.  Electrostatic filters may also be used; wash the filter monthly.  Window air conditioners may be used, but do not clean the air as well as a central air conditioner.  Change or wash the filter monthly. Keep windows closed.  Do not use attic fans.   Encase the mattress, box springs and pillows with zippered, dust proof covers. Wash the bed linens in hot water  weekly.   Remove carpet, especially from the bedroom. Remove stuffed animals, throw pillows, dust ruffles, heavy drapes and other items that collect dust from the bedroom. Do not use a humidifier.   Use wood, vinyl or leather furniture instead of cloth furniture in the bedroom. Keep the indoor humidity at 30 - 40%.    Molds - Indoor avoidance Use air conditioning to reduce indoor humidity.  Do not use a humidifier. Keep indoor humidity at 30 - 40%.  Use a dehumidifier if needed. In the bathroom use an exhaust fan or open a  window after showering.  Wipe down damp surfaces after showering.  Clean bathrooms with a mold-killing solution (diluted bleach, or products like Tilex, etc) at least once a month. In the kitchen use an exhaust fan to remove steam from cooking.  Throw away spoiled foods immediately, and empty garbage daily.  Empty water  pans below self-defrosting refrigerators frequently. Vent the clothes dryer to the outside. Limit indoor houseplants; mold grows in the dirt.  No houseplants in the bedroom. Remove carpet from the bedroom. Encase the mattress and box springs with a zippered encasing.  Molds - Outdoor avoidance Avoid being outside when the grass is being mowed, or the ground is tilled. Avoid playing in leaves, pine straw, hay, etc.  Dead plant materials contain mold. Avoid going into barns or grain storage areas. Remove leaves, clippings and compost from around the home.  Pollen Avoidance Pollen levels are highest during the mid-day and afternoon.  Consider this when planning outdoor activities. Avoid being outside when the grass is being mowed, or wear a mask if the pollen-allergic person must be the one to mow the grass. Keep the windows closed to keep pollen outside of the home. Use an air conditioner to filter the air. Take a shower, wash hair, and change clothing after working or playing outdoors during pollen season. Pet Dander- Cats  Keep the pet out of your bedroom and restrict it to only a few rooms. Be advised that keeping the pet in only  one room will not limit the allergens to that room. Don't pet, hug or kiss the pet; if you do, wash your hands with soap and water . High-efficiency particulate air (HEPA) cleaners run continuously in a bedroom or living room can reduce allergen levels over time. Regular use of a high-efficiency vacuum cleaner or a central vacuum can reduce allergen levels. Giving your pet a bath at least once a week can reduce airborne allergen.

## 2024-01-29 DIAGNOSIS — Z23 Encounter for immunization: Secondary | ICD-10-CM | POA: Diagnosis not present

## 2024-02-07 DIAGNOSIS — E1122 Type 2 diabetes mellitus with diabetic chronic kidney disease: Secondary | ICD-10-CM | POA: Diagnosis not present

## 2024-02-07 DIAGNOSIS — I10 Essential (primary) hypertension: Secondary | ICD-10-CM | POA: Diagnosis not present

## 2024-02-10 DIAGNOSIS — E1122 Type 2 diabetes mellitus with diabetic chronic kidney disease: Secondary | ICD-10-CM | POA: Diagnosis not present

## 2024-02-10 DIAGNOSIS — Z0000 Encounter for general adult medical examination without abnormal findings: Secondary | ICD-10-CM | POA: Diagnosis not present

## 2024-02-10 DIAGNOSIS — E782 Mixed hyperlipidemia: Secondary | ICD-10-CM | POA: Diagnosis not present

## 2024-02-10 DIAGNOSIS — I10 Essential (primary) hypertension: Secondary | ICD-10-CM | POA: Diagnosis not present

## 2024-02-10 DIAGNOSIS — E870 Hyperosmolality and hypernatremia: Secondary | ICD-10-CM | POA: Diagnosis not present

## 2024-02-10 DIAGNOSIS — E876 Hypokalemia: Secondary | ICD-10-CM | POA: Diagnosis not present

## 2024-02-10 DIAGNOSIS — M170 Bilateral primary osteoarthritis of knee: Secondary | ICD-10-CM | POA: Diagnosis not present

## 2024-02-10 DIAGNOSIS — Z0001 Encounter for general adult medical examination with abnormal findings: Secondary | ICD-10-CM | POA: Diagnosis not present

## 2024-02-10 DIAGNOSIS — Z79899 Other long term (current) drug therapy: Secondary | ICD-10-CM | POA: Diagnosis not present

## 2024-02-10 DIAGNOSIS — Z7984 Long term (current) use of oral hypoglycemic drugs: Secondary | ICD-10-CM | POA: Diagnosis not present

## 2024-02-10 DIAGNOSIS — K5900 Constipation, unspecified: Secondary | ICD-10-CM | POA: Diagnosis not present

## 2024-02-10 DIAGNOSIS — R059 Cough, unspecified: Secondary | ICD-10-CM | POA: Diagnosis not present

## 2024-02-14 ENCOUNTER — Other Ambulatory Visit: Payer: Self-pay | Admitting: Internal Medicine

## 2024-03-26 ENCOUNTER — Encounter: Payer: Self-pay | Admitting: Internal Medicine

## 2024-03-26 ENCOUNTER — Ambulatory Visit: Admitting: Internal Medicine

## 2024-03-26 VITALS — BP 130/72 | HR 81 | Temp 98.1°F | Resp 14

## 2024-03-26 DIAGNOSIS — J3089 Other allergic rhinitis: Secondary | ICD-10-CM | POA: Diagnosis not present

## 2024-03-26 DIAGNOSIS — R490 Dysphonia: Secondary | ICD-10-CM

## 2024-03-26 DIAGNOSIS — J302 Other seasonal allergic rhinitis: Secondary | ICD-10-CM

## 2024-03-26 DIAGNOSIS — R053 Chronic cough: Secondary | ICD-10-CM

## 2024-03-26 NOTE — Patient Instructions (Addendum)
 Allergic Rhinitis:   Chronic Cough:  - Positive skin test 12/2023: grasses, trees, molds, dust mite, cats - Use nasal saline rinses before nose sprays such as with Neilmed Sinus Rinse.  Use distilled water .   - Use Flonase  2 sprays each nostril daily. Aim upward and outward. - Use Azelastine  1 spray each nostril twice daily. Aim upward and outward. - Use Zyrtec  10 mg daily.   - Consider allergy  shots as long term control of your symptoms by teaching your immune system to be more tolerant of your allergy  triggers - Will refer to ENT

## 2024-03-26 NOTE — Progress Notes (Signed)
   FOLLOW UP Date of Service/Encounter:  03/26/24   Subjective:  Sylvia Sparks (DOB: [birth year]) is a 78 y.o. female who [facility] for follow up for chronic cough and allergic rhinitis.   History obtained from: chart review and patient. Last seen on 01/23/2204 with me for skin testing with reactivity to aeroallergens, started on Flonase , PRN Azelastine , Zyrtec  and stopping Protonix /Pepcid /Wixela due to lack of response for chronic cough.   Still having trouble with chronic cough throughout the day. No worsening noted with stopping the inhalers or reflux medications.  Still having post nasal drainage but also noticing voice changes; sometimes very nasaly.  Using Flonase  and Zyrtec  daily.    Past Medical History: Past Medical History:  Diagnosis Date   Anemia    Asthma    Diabetes mellitus without complication (HCC)    Dyspnea    with pneumonia   Hypercholesteremia    Hypertension    OA (osteoarthritis)    Pneumonia     Objective:  BP 130/72   Pulse 81   Temp 98.1 F (36.7 C)   Resp 14   SpO2 99%  There is no height or weight on file to calculate BMI. Physical Exam: GEN: alert, well developed HEENT: clear conjunctiva, nose with mild inferior turbinate hypertrophy, pink nasal mucosa, slight clear rhinorrhea, + cobblestoning HEART: regular rate and rhythm, no murmur LUNGS: clear to auscultation bilaterally, no coughing, unlabored respiration SKIN: no rashes or lesions  Assessment:   1. Seasonal and perennial allergic rhinitis   2. Chronic cough   3. Dysphonia     Plan/Recommendations:  Chronic Cough - Likely related to post nasal drainage.  See below  - Stop Protonix /Pepcid .  If symptoms worsen with stopping it, restart the medications as this would indicate you have silent reflux.   - Low suspicion for asthma- normal spirometry, no response to Albuterol  and Wixela.   - Will refer to ENT for evaluation in setting of  chronic cough and voice changes.  Allergic Rhinitis:   - Uncontrolled, add Azelastine .  - Positive skin test 12/2023: grasses, trees, molds, dust mite, cats - Use nasal saline rinses before nose sprays such as with Neilmed Sinus Rinse.  Use distilled water .   - Use Flonase  2 sprays each nostril daily. Aim upward and outward. - Use Azelastine  1 spray each nostril twice daily. Aim upward and outward. - Use Zyrtec  10 mg daily.   - Consider allergy  shots as long term control of your symptoms by teaching your immune system to be more tolerant of your allergy  triggers     Return in about 3 months (around 06/24/2024).  Arleta Blanch, MD [facility]

## 2024-03-29 ENCOUNTER — Encounter (INDEPENDENT_AMBULATORY_CARE_PROVIDER_SITE_OTHER): Payer: Self-pay

## 2024-04-06 ENCOUNTER — Telehealth: Payer: Self-pay | Admitting: Internal Medicine

## 2024-04-06 NOTE — Telephone Encounter (Signed)
 Patient is scheduled with ENT for 04/27/24 at 10:30 am with Dr. Masciello

## 2024-04-10 DIAGNOSIS — B8809 Other acariasis: Secondary | ICD-10-CM | POA: Diagnosis not present

## 2024-04-10 DIAGNOSIS — H01004 Unspecified blepharitis left upper eyelid: Secondary | ICD-10-CM | POA: Diagnosis not present

## 2024-04-10 DIAGNOSIS — E119 Type 2 diabetes mellitus without complications: Secondary | ICD-10-CM | POA: Diagnosis not present

## 2024-04-10 DIAGNOSIS — H35363 Drusen (degenerative) of macula, bilateral: Secondary | ICD-10-CM | POA: Diagnosis not present

## 2024-04-10 DIAGNOSIS — H01001 Unspecified blepharitis right upper eyelid: Secondary | ICD-10-CM | POA: Diagnosis not present

## 2024-04-11 DIAGNOSIS — R053 Chronic cough: Secondary | ICD-10-CM | POA: Diagnosis not present

## 2024-04-11 DIAGNOSIS — J302 Other seasonal allergic rhinitis: Secondary | ICD-10-CM | POA: Diagnosis not present

## 2024-04-11 DIAGNOSIS — K219 Gastro-esophageal reflux disease without esophagitis: Secondary | ICD-10-CM | POA: Diagnosis not present

## 2024-04-11 DIAGNOSIS — I10 Essential (primary) hypertension: Secondary | ICD-10-CM | POA: Diagnosis not present

## 2024-04-11 DIAGNOSIS — Z79899 Other long term (current) drug therapy: Secondary | ICD-10-CM | POA: Diagnosis not present

## 2024-04-11 DIAGNOSIS — I129 Hypertensive chronic kidney disease with stage 1 through stage 4 chronic kidney disease, or unspecified chronic kidney disease: Secondary | ICD-10-CM | POA: Diagnosis not present

## 2024-04-11 DIAGNOSIS — N189 Chronic kidney disease, unspecified: Secondary | ICD-10-CM | POA: Diagnosis not present

## 2024-04-27 ENCOUNTER — Ambulatory Visit (INDEPENDENT_AMBULATORY_CARE_PROVIDER_SITE_OTHER)

## 2024-04-27 ENCOUNTER — Encounter (INDEPENDENT_AMBULATORY_CARE_PROVIDER_SITE_OTHER): Payer: Self-pay

## 2024-04-27 VITALS — BP 142/82 | HR 66 | Ht 64.0 in | Wt 162.0 lb

## 2024-04-27 DIAGNOSIS — R490 Dysphonia: Secondary | ICD-10-CM

## 2024-04-27 DIAGNOSIS — J383 Other diseases of vocal cords: Secondary | ICD-10-CM | POA: Diagnosis not present

## 2024-04-27 DIAGNOSIS — K219 Gastro-esophageal reflux disease without esophagitis: Secondary | ICD-10-CM

## 2024-04-27 DIAGNOSIS — R053 Chronic cough: Secondary | ICD-10-CM | POA: Diagnosis not present

## 2024-04-27 NOTE — Progress Notes (Signed)
 Dear Dr. Tobie, Here is my assessment for our mutual patient, Sylvia Sparks. Thank you for allowing me the opportunity to care for your patient. Please do not hesitate to contact me should you have any other questions. Sincerely, Dr. Hadassah Parody  Otolaryngology Clinic Note Referring provider: Dr. Tobie HPI:   Initial HPI (04/27/2024) Seven 79-year-old female who presents for evaluation of chronic cough and dysphonia.  Persistent cough since December 2024, worsened in March 2025 after upper respiratory tract infection, CBC worsening.  Dytide episodes are sporadic but can be and sudden and intense.  Coughing is nonproductive.  Can be triggered by eating but otherwise does not seem to have triggers.  Coughing often worsens when she goes to sleep at night and lays down.  She has occasional throat clearing.  She is currently on Pepcid  and Protonix , taking 1 in the morning and 1 at nighttime. Uses NeilMed sinus rinse once daily as recommended by her allergist.  No significant nasal congestion or rhinorrhea.  Occasional postnasal drainage.  Previously received allergy  injections after relocating to [place]  but no notable allergy  issues since then  Also notices that at the time the cough started she has noticed some voice changes.  Has trouble singing in church.  Independent Review of Additional Tests or Records:  Referral note 03/26/2024 Arleta Tobie, MD: Using Flonase  and Zyrtec  daily, having trouble with chronic cough.  Low suspicion for asthma normal spirometry no response to albuterol .  Positive skin test 12/2023 to grasses trees molds dust cats  Pulmonology note 01/19/2024 Ozell america, MD: Cough since COVID December 2024 and worse later in March 2025 after an upper respiratory tract infection.  History of allergic rhinitis this had resolved as of 01/19/2024.  Recommended allergy  referral and following up with pulmonology as needed  Allergy  testing 01/23/2024 reviewed showing allergy  to  grasses trees molds dust and cat  PMH/Meds/All/SocHx/FamHx/ROS:   Past Medical History:  Diagnosis Date   Anemia    Asthma    Diabetes mellitus without complication (HCC)    Dyspnea    with pneumonia   Hypercholesteremia    Hypertension    OA (osteoarthritis)    Pneumonia      Past Surgical History:  Procedure Laterality Date   COLONOSCOPY N/A 06/20/2015   Procedure: COLONOSCOPY;  Surgeon: Lamar CHRISTELLA Hollingshead, MD;  Location: [facility];  Service: Endoscopy;  Laterality: N/A;  9:00 AM   COLONOSCOPY N/A 03/07/2019   Procedure: COLONOSCOPY;  Surgeon: Hollingshead Lamar CHRISTELLA, MD;  Location: [facility];  Service: Endoscopy;  Laterality: N/A;  1:00   POLYPECTOMY N/A 06/20/2015   Procedure: POLYPECTOMY;  Surgeon: Lamar CHRISTELLA Hollingshead, MD;  Location: [facility];  Service: Endoscopy;  Laterality: N/A;  Hepatic flexure polyps x 2/ Polyp opposite ileocecal valve   POLYPECTOMY  03/07/2019   Procedure: POLYPECTOMY;  Surgeon: Hollingshead Lamar CHRISTELLA, MD;  Location: [facility];  Service: Endoscopy;;  transverse   TOTAL KNEE ARTHROPLASTY Right 05/09/2018   Procedure: TOTAL KNEE ARTHROPLASTY;  Surgeon: Ernie Cough, MD;  Location: [facility];  Service: Orthopedics;  Laterality: Right;  70 mins   TOTAL KNEE ARTHROPLASTY Left 11/25/2020   Procedure: TOTAL KNEE ARTHROPLASTY;  Surgeon: Ernie Cough, MD;  Location: [facility];  Service: Orthopedics;  Laterality: Left;    Family History  Problem Relation Age of Onset   Allergic rhinitis Neg Hx    Angioedema Neg Hx    Asthma Neg Hx    Eczema Neg Hx    Urticaria Neg  Hx      Social Connections: Not on file     Current Outpatient Medications  Medication Instructions   amLODipine  (NORVASC ) 10 mg, Daily   ascorbic acid (VITAMIN C) 250 MG CHEW    aspirin  EC (ASPIRIN  ADULT LOW DOSE) 81 mg, Daily   Azelastine  HCl 137 MCG/SPRAY SOLN PLACE 2 SPRAYS INTO BOTH NOSTRILS 2 (TWO) TIMES DAILY AS NEEDED FOR ALLERGIES OR RHINITIS. USE IN EACH NOSTRIL AS DIRECTED   benzonatate   (TESSALON ) 200 mg, Oral, 3 times daily PRN   cetirizine  (ZYRTEC ) 10 mg, Oral, Daily   dapagliflozin propanediol (FARXIGA) 10 mg, Daily   famotidine  (PEPCID ) 20 MG tablet TAKE 1 TABLET BY MOUTH EVERYDAY AT BEDTIME   Ferrous Sulfate  142 mg, Daily   fluticasone  (FLONASE ) 50 MCG/ACT nasal spray SPRAY 2 SPRAYS INTO EACH NOSTRIL EVERY DAY   glipiZIDE  (GLUCOTROL ) 2.5 mg, Daily   Liniments (SALONPAS PAIN RELIEF PATCH EX) 1 application , Daily PRN   lovastatin (MEVACOR) 10 mg, Daily at bedtime   Multiple Vitamins-Minerals (MULTIVITAMINS THER. W/MINERALS) TABS tablet 2 tablets, Daily   pantoprazole  (PROTONIX ) 40 MG tablet TAKE ONE TABLET 30-60 MIN PRIOR TO FIRST MEAL DAILY   potassium chloride  (K-DUR) 10 MEQ tablet 10 mEq, 2 times daily   SODIUM FLUORIDE 5000 PPM 1.1 % GEL dental gel Daily at bedtime   valsartan (DIOVAN) 160 mg, Daily   Vitamin D 2,000 Units, Daily     Physical Exam:   BP (!) 142/82 (BP Location: Left Arm, Patient Position: Sitting) Comment: patient states she is feeling fine maybe just nervous being at the doctor  Pulse 66   Ht 5' 4 (1.626 m)   Wt 162 lb (73.5 kg)   SpO2 96%   BMI 27.81 kg/m   Salient findings:  CN II-XII intact   Bilateral EAC clear and TM intact with well pneumatized middle ear spaces  Anterior rhinoscopy: Septum midline anteriorly; bilateral inferior turbinates with hypertrophy  No lesions of oral cavity/oropharynx  No obviously palpable neck masses/lymphadenopathy/thyromegaly  No respiratory distress or stridor Dysphonic with gravelly quality to voice TFL was indicated to better evaluate the proximal airway, given the patient's history and exam findings, and is detailed below.  Seprately Identifiable Procedures:  Prior to initiating any procedures, risks/benefits/alternatives were explained to the patient and verbal consent obtained.  Procedure Note (04/27/2024) Pre-procedure diagnosis:  Dysphonia  Post-procedure diagnosis: Same, age-related  vocal fold atrophy Procedure: Transnasal Fiberoptic Laryngoscopy, CPT 31575 - Mod 25 Indication: Dysphonia Complications: None apparent EBL: 0 mL  The procedure was undertaken to further evaluate the patient's complaint of dysphonia, with mirror exam inadequate for appropriate examination due to gag reflex and poor patient tolerance  Procedure:  Patient was identified as correct patient. Verbal consent was obtained. The nose was sprayed with oxymetazoline and 4% lidocaine . The The flexible laryngoscope was passed through the nose to view the nasal cavity, pharynx (oropharynx, hypopharynx) and larynx.  The larynx was examined at rest and during multiple phonatory tasks. Documentation was obtained and reviewed with patient. The scope was removed. The patient tolerated the procedure well.  Findings: The nasal cavity and nasopharynx did not reveal any masses or lesions, mucosa appeared to be without obvious lesions. The tongue base, pharyngeal walls, piriform sinuses, vallecula, epiglottis and postcricoid region are normal in appearance EXCEPT: Age-related vocal fold atrophy of bilateral vocal folds with incomplete closure, 1 mm glottic gap.  Postcricoid edema present.  the visualized portion of the subglottis and proximal trachea is widely patent.  The vocal folds are mobile bilaterally. There are no lesions on the free edge of the vocal folds nor elsewhere in the larynx worrisome for malignancy.    Electronically signed by: Hadassah JAYSON Parody, MD 04/27/2024 1:25 PM   Impression & Plans:  Shanara Schnieders is a 79 y.o. female with   1. Chronic cough   2. 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 Laryngeal examination demonstrated mild posterior laryngeal edema without lesions, masses, or infection. No evidence of active allergy  or significant nasal drainage contributing to cough.  - Recommended continuation of famotidine  and pantoprazole  as prescribed by her primary care provider. - Performed laryngeal examination with video documentation. - Provided education regarding the multifactorial etiology of chronic cough. - Discussed cough suppression therapy with a voice therapist as a non-pharmacologic intervention. - Placed referral to voice therapy for cough suppression and voice management.  - If voice therapy is not effective, we could trial gabapentin - Scheduled follow-up in four months after voice therapy  Age-related vocal fold atrophy Age-related thinning of the vocal folds is resulting in incomplete glottic closure, mild hoarseness, breathiness, and vocal fatigue, particularly during singing. These changes are benign and unrelated to recent illness or cough. No dangerous pathology identified on laryngeal examination. Voice therapy and vocal fold injection are available options if symptoms become bothersome. - Provided education regarding the benign nature of presbyphonia and its contribution to voice changes and vocal fatigue. - Discussed therapeutic options including voice therapy and vocal fold injection to improve glottic closure and voice quality. - Placed referral to voice therapy for cough suppression therapy and she may discuss with them if they have techniques for efficient voice use in the setting of presbylarynges    See below regarding exact medications prescribed this encounter including dosages and route: No orders of the defined types were placed in this encounter.   Thank you for allowing me the opportunity to care for your patient. Please do not hesitate to contact me should you have any  other questions.  Sincerely, Hadassah Parody, MD Otolaryngologist (ENT), [facility] Phone: [phone] Fax: [phone]  MDM:  Level 4 Complexity/Problems addressed: 4-chronic worsening problem Data complexity: 4-  independent review of 2 notes, 1 lab - Morbidity: Low-   - Prescription Drug prescribed or managed: No

## 2024-05-26 ENCOUNTER — Other Ambulatory Visit: Payer: Self-pay | Admitting: Internal Medicine

## 2024-05-27 ENCOUNTER — Other Ambulatory Visit: Payer: Self-pay | Admitting: Internal Medicine

## 2024-06-29 ENCOUNTER — Ambulatory Visit: Admitting: Family Medicine

## 2024-08-29 ENCOUNTER — Ambulatory Visit (INDEPENDENT_AMBULATORY_CARE_PROVIDER_SITE_OTHER)
